# Patient Record
Sex: Female | Born: 1980
Health system: Southern US, Community
[De-identification: ages and names within clinical notes are randomized; demographics above are authoritative.]

## PROBLEM LIST (undated history)

## (undated) DIAGNOSIS — Z9889 Other specified postprocedural states: Secondary | ICD-10-CM

## (undated) DIAGNOSIS — F419 Anxiety disorder, unspecified: Secondary | ICD-10-CM

## (undated) DIAGNOSIS — T781XXA Other adverse food reactions, not elsewhere classified, initial encounter: Secondary | ICD-10-CM

## (undated) DIAGNOSIS — Z862 Personal history of diseases of the blood and blood-forming organs and certain disorders involving the immune mechanism: Secondary | ICD-10-CM

## (undated) DIAGNOSIS — R112 Nausea with vomiting, unspecified: Secondary | ICD-10-CM

## (undated) DIAGNOSIS — M1990 Unspecified osteoarthritis, unspecified site: Secondary | ICD-10-CM

## (undated) HISTORY — PX: WISDOM TOOTH EXTRACTION: SHX21

## (undated) HISTORY — PX: SHOULDER SURGERY: SHX246

---

## 2006-01-08 ENCOUNTER — Emergency Department (HOSPITAL_COMMUNITY): Admission: EM | Admit: 2006-01-08 | Discharge: 2006-01-08 | Payer: Self-pay | Admitting: Emergency Medicine

## 2008-08-31 ENCOUNTER — Other Ambulatory Visit: Admission: RE | Admit: 2008-08-31 | Discharge: 2008-08-31 | Payer: Self-pay | Admitting: Obstetrics and Gynecology

## 2010-06-02 ENCOUNTER — Emergency Department (HOSPITAL_COMMUNITY)
Admission: EM | Admit: 2010-06-02 | Discharge: 2010-06-02 | Disposition: A | Discharge status: Home / Self Care | Payer: BC Managed Care – PPO | Attending: Emergency Medicine | Admitting: Emergency Medicine

## 2010-06-02 DIAGNOSIS — R21 Rash and other nonspecific skin eruption: Secondary | ICD-10-CM | POA: Insufficient documentation

## 2010-06-02 DIAGNOSIS — M25569 Pain in unspecified knee: Secondary | ICD-10-CM | POA: Insufficient documentation

## 2010-06-02 DIAGNOSIS — R209 Unspecified disturbances of skin sensation: Secondary | ICD-10-CM | POA: Insufficient documentation

## 2010-06-02 LAB — CBC
HCT: 40.3 % (ref 36.0–46.0)
MCHC: 33 g/dL (ref 30.0–36.0)
Platelets: 226 10*3/uL (ref 150–400)
RDW: 12.5 % (ref 11.5–15.5)

## 2010-06-02 LAB — POCT PREGNANCY, URINE: Preg Test, Ur: NEGATIVE

## 2010-06-02 LAB — BASIC METABOLIC PANEL
BUN: 15 mg/dL (ref 6–23)
Calcium: 9.1 mg/dL (ref 8.4–10.5)
GFR calc non Af Amer: >60 mL/min (ref >60)
Glucose, Bld: 86 mg/dL (ref 70–99)
Sodium: 137 mEq/L (ref 135–145)

## 2010-06-02 LAB — DIFFERENTIAL
Basophils Absolute: 0.1 10*3/uL (ref 0.0–0.1)
Eosinophils Absolute: 0.4 10*3/uL (ref 0.0–0.7)
Eosinophils Relative: 3 % (ref 0–5)
Lymphocytes Relative: 23 % (ref 12–46)
Monocytes Absolute: 1.2 10*3/uL — ABNORMAL HIGH (ref 0.1–1.0)

## 2010-06-02 LAB — SEDIMENTATION RATE: Sed Rate: 1 mm/hr (ref 0–22)

## 2010-09-25 ENCOUNTER — Other Ambulatory Visit (HOSPITAL_COMMUNITY)
Admission: RE | Admit: 2010-09-25 | Discharge: 2010-09-25 | Disposition: A | Discharge status: Home / Self Care | Payer: BC Managed Care – PPO | Source: Ambulatory Visit | Attending: Obstetrics and Gynecology | Admitting: Obstetrics and Gynecology

## 2010-09-25 DIAGNOSIS — Z01419 Encounter for gynecological examination (general) (routine) without abnormal findings: Secondary | ICD-10-CM | POA: Insufficient documentation

## 2012-04-27 NOTE — L&D Delivery Note (Signed)
Delivery Note At 11:45 AM a viable female was delivered via Vaginal, Spontaneous Delivery (Presentation: ; Occiput Anterior).  APGAR: 9, 9; weight .   Placenta status: Intact, Spontaneous.  Cord:  with the following complications: None.  Cord pH: n/a  Anesthesia: Epidural  Episiotomy: None Lacerations: None Suture Repair: None Est. Blood Loss (mL): 300  Mom to postpartum.  Baby to skin to skin.  Geryl Rankins 12/18/2012, 1:09 PM

## 2012-05-13 LAB — OB RESULTS CONSOLE ABO/RH: RH Type: POSITIVE

## 2012-05-27 ENCOUNTER — Other Ambulatory Visit (HOSPITAL_COMMUNITY)
Admission: RE | Admit: 2012-05-27 | Discharge: 2012-05-27 | Disposition: A | Discharge status: Home / Self Care | Payer: BC Managed Care – PPO | Source: Ambulatory Visit | Attending: Obstetrics and Gynecology | Admitting: Obstetrics and Gynecology

## 2012-05-27 ENCOUNTER — Other Ambulatory Visit: Payer: Self-pay | Admitting: Obstetrics and Gynecology

## 2012-05-27 DIAGNOSIS — Z01419 Encounter for gynecological examination (general) (routine) without abnormal findings: Secondary | ICD-10-CM | POA: Insufficient documentation

## 2012-05-27 DIAGNOSIS — Z113 Encounter for screening for infections with a predominantly sexual mode of transmission: Secondary | ICD-10-CM | POA: Insufficient documentation

## 2012-05-27 DIAGNOSIS — Z1151 Encounter for screening for human papillomavirus (HPV): Secondary | ICD-10-CM | POA: Insufficient documentation

## 2012-08-24 ENCOUNTER — Inpatient Hospital Stay (HOSPITAL_COMMUNITY): Admission: AD | Admit: 2012-08-24 | Payer: Self-pay | Source: Ambulatory Visit | Admitting: Obstetrics and Gynecology

## 2012-12-18 ENCOUNTER — Inpatient Hospital Stay (HOSPITAL_COMMUNITY): Payer: BC Managed Care – PPO | Admitting: Anesthesiology

## 2012-12-18 ENCOUNTER — Encounter (HOSPITAL_COMMUNITY): Payer: Self-pay | Admitting: *Deleted

## 2012-12-18 ENCOUNTER — Inpatient Hospital Stay (HOSPITAL_COMMUNITY)
Admission: AD | Admit: 2012-12-18 | Discharge: 2012-12-20 | DRG: 373 | Disposition: A | Discharge status: Home / Self Care | Payer: BC Managed Care – PPO | Source: Ambulatory Visit | Attending: Obstetrics and Gynecology | Admitting: Obstetrics and Gynecology

## 2012-12-18 ENCOUNTER — Encounter (HOSPITAL_COMMUNITY): Payer: Self-pay | Admitting: Anesthesiology

## 2012-12-18 DIAGNOSIS — R635 Abnormal weight gain: Secondary | ICD-10-CM | POA: Diagnosis present

## 2012-12-18 DIAGNOSIS — O365990 Maternal care for other known or suspected poor fetal growth, unspecified trimester, not applicable or unspecified: Principal | ICD-10-CM | POA: Diagnosis present

## 2012-12-18 DIAGNOSIS — IMO0001 Reserved for inherently not codable concepts without codable children: Secondary | ICD-10-CM

## 2012-12-18 LAB — CBC
Hemoglobin: 12 g/dL (ref 12.0–15.0)
MCH: 30.8 pg (ref 26.0–34.0)
Platelets: 260 10*3/uL (ref 150–400)
RBC: 3.9 MIL/uL (ref 3.87–5.11)
WBC: 16.5 10*3/uL — ABNORMAL HIGH (ref 4.0–10.5)

## 2012-12-18 LAB — RPR: RPR Ser Ql: NONREACTIVE

## 2012-12-18 LAB — POCT FERN TEST: POCT Fern Test: POSITIVE

## 2012-12-18 MED ORDER — ONDANSETRON HCL 4 MG/2ML IJ SOLN: 4.0000 mg | INTRAMUSCULAR | Status: DC | PRN

## 2012-12-18 MED ORDER — LACTATED RINGERS IV SOLN: 500.0000 mL | INTRAVENOUS | Status: DC | PRN

## 2012-12-18 MED ORDER — CITRIC ACID-SODIUM CITRATE 334-500 MG/5ML PO SOLN: 30.0000 mL | ORAL | Status: DC | PRN

## 2012-12-18 MED ORDER — BENZOCAINE-MENTHOL 20-0.5 % EX AERO: 1.0000 "application " | INHALATION_SPRAY | CUTANEOUS | Status: DC | PRN

## 2012-12-18 MED ORDER — IBUPROFEN 600 MG PO TABS
600.0000 mg | ORAL_TABLET | Freq: Four times a day (QID) | ORAL | Status: DC | PRN
  Administered 2012-12-18: 600 mg via ORAL
  Filled 2012-12-18 (×7): qty 1

## 2012-12-18 MED ORDER — PHENYLEPHRINE 40 MCG/ML (10ML) SYRINGE FOR IV PUSH (FOR BLOOD PRESSURE SUPPORT)
80.0000 ug | PREFILLED_SYRINGE | INTRAVENOUS | Status: DC | PRN
  Filled 2012-12-18: qty 2
  Filled 2012-12-18: qty 5

## 2012-12-18 MED ORDER — SENNOSIDES-DOCUSATE SODIUM 8.6-50 MG PO TABS
2.0000 | ORAL_TABLET | Freq: Every day | ORAL | Status: DC
  Administered 2012-12-19: 2 via ORAL

## 2012-12-18 MED ORDER — LACTATED RINGERS IV SOLN
INTRAVENOUS | Status: DC
  Administered 2012-12-18: 09:00:00 via INTRAVENOUS

## 2012-12-18 MED ORDER — FENTANYL 2.5 MCG/ML BUPIVACAINE 1/10 % EPIDURAL INFUSION (WH - ANES)
14.0000 mL/h | INTRAMUSCULAR | Status: DC | PRN
  Filled 2012-12-18: qty 125

## 2012-12-18 MED ORDER — FENTANYL 2.5 MCG/ML BUPIVACAINE 1/10 % EPIDURAL INFUSION (WH - ANES)
INTRAMUSCULAR | Status: DC | PRN
  Administered 2012-12-18: 14 mL/h via EPIDURAL

## 2012-12-18 MED ORDER — LIDOCAINE HCL (PF) 1 % IJ SOLN
INTRAMUSCULAR | Status: DC | PRN
  Administered 2012-12-18 (×3): 5 mL

## 2012-12-18 MED ORDER — ONDANSETRON HCL 4 MG PO TABS: 4.0000 mg | ORAL_TABLET | ORAL | Status: DC | PRN

## 2012-12-18 MED ORDER — ONDANSETRON HCL 4 MG/2ML IJ SOLN: 4.0000 mg | Freq: Four times a day (QID) | INTRAMUSCULAR | Status: DC | PRN

## 2012-12-18 MED ORDER — OXYTOCIN BOLUS FROM INFUSION: 500.0000 mL | INTRAVENOUS | Status: DC

## 2012-12-18 MED ORDER — FERROUS SULFATE 325 (65 FE) MG PO TABS
325.0000 mg | ORAL_TABLET | Freq: Two times a day (BID) | ORAL | Status: DC
  Administered 2012-12-18 – 2012-12-20 (×4): 325 mg via ORAL
  Filled 2012-12-18 (×4): qty 1

## 2012-12-18 MED ORDER — DIPHENHYDRAMINE HCL 25 MG PO CAPS: 25.0000 mg | ORAL_CAPSULE | Freq: Four times a day (QID) | ORAL | Status: DC | PRN

## 2012-12-18 MED ORDER — ZOLPIDEM TARTRATE 5 MG PO TABS: 5.0000 mg | ORAL_TABLET | Freq: Every evening | ORAL | Status: DC | PRN

## 2012-12-18 MED ORDER — LIDOCAINE HCL (PF) 1 % IJ SOLN
30.0000 mL | INTRAMUSCULAR | Status: DC | PRN
  Filled 2012-12-18 (×2): qty 30

## 2012-12-18 MED ORDER — PHENYLEPHRINE 40 MCG/ML (10ML) SYRINGE FOR IV PUSH (FOR BLOOD PRESSURE SUPPORT)
80.0000 ug | PREFILLED_SYRINGE | INTRAVENOUS | Status: DC | PRN
  Filled 2012-12-18: qty 2

## 2012-12-18 MED ORDER — EPHEDRINE 5 MG/ML INJ
10.0000 mg | INTRAVENOUS | Status: DC | PRN
  Filled 2012-12-18: qty 2

## 2012-12-18 MED ORDER — EPHEDRINE 5 MG/ML INJ
10.0000 mg | INTRAVENOUS | Status: DC | PRN
  Filled 2012-12-18: qty 2
  Filled 2012-12-18: qty 4

## 2012-12-18 MED ORDER — METHYLERGONOVINE MALEATE 0.2 MG PO TABS: 0.2000 mg | ORAL_TABLET | ORAL | Status: DC | PRN

## 2012-12-18 MED ORDER — LACTATED RINGERS IV SOLN
500.0000 mL | Freq: Once | INTRAVENOUS | Status: AC
  Administered 2012-12-18: 500 mL via INTRAVENOUS

## 2012-12-18 MED ORDER — IBUPROFEN 600 MG PO TABS
600.0000 mg | ORAL_TABLET | Freq: Four times a day (QID) | ORAL | Status: DC
  Administered 2012-12-18 – 2012-12-20 (×7): 600 mg via ORAL
  Filled 2012-12-18 (×2): qty 1

## 2012-12-18 MED ORDER — OXYCODONE-ACETAMINOPHEN 5-325 MG PO TABS: 1.0000 | ORAL_TABLET | ORAL | Status: DC | PRN

## 2012-12-18 MED ORDER — TETANUS-DIPHTH-ACELL PERTUSSIS 5-2.5-18.5 LF-MCG/0.5 IM SUSP
0.5000 mL | Freq: Once | INTRAMUSCULAR | Status: AC
  Administered 2012-12-19: 0.5 mL via INTRAMUSCULAR

## 2012-12-18 MED ORDER — DIPHENHYDRAMINE HCL 50 MG/ML IJ SOLN: 12.5000 mg | INTRAMUSCULAR | Status: DC | PRN

## 2012-12-18 MED ORDER — DIBUCAINE 1 % RE OINT: 1.0000 "application " | TOPICAL_OINTMENT | RECTAL | Status: DC | PRN

## 2012-12-18 MED ORDER — PRENATAL MULTIVITAMIN CH
1.0000 | ORAL_TABLET | Freq: Every day | ORAL | Status: DC
  Administered 2012-12-19 – 2012-12-20 (×2): 1 via ORAL
  Filled 2012-12-18 (×2): qty 1

## 2012-12-18 MED ORDER — METHYLERGONOVINE MALEATE 0.2 MG/ML IJ SOLN
0.2000 mg | INTRAMUSCULAR | Status: DC | PRN
Start: 2012-12-18 — End: 2012-12-20

## 2012-12-18 MED ORDER — FLEET ENEMA 7-19 GM/118ML RE ENEM: 1.0000 | ENEMA | RECTAL | Status: DC | PRN

## 2012-12-18 MED ORDER — SIMETHICONE 80 MG PO CHEW: 80.0000 mg | CHEWABLE_TABLET | ORAL | Status: DC | PRN

## 2012-12-18 MED ORDER — MEASLES, MUMPS & RUBELLA VAC ~~LOC~~ INJ
0.5000 mL | INJECTION | Freq: Once | SUBCUTANEOUS | Status: DC
Start: 2012-12-19 — End: 2012-12-20

## 2012-12-18 MED ORDER — OXYTOCIN 40 UNITS IN LACTATED RINGERS INFUSION - SIMPLE MED: 62.5000 mL/h | INTRAVENOUS | Status: DC | PRN

## 2012-12-18 MED ORDER — WITCH HAZEL-GLYCERIN EX PADS: 1.0000 "application " | MEDICATED_PAD | CUTANEOUS | Status: DC | PRN

## 2012-12-18 MED ORDER — OXYTOCIN 40 UNITS IN LACTATED RINGERS INFUSION - SIMPLE MED
62.5000 mL/h | INTRAVENOUS | Status: DC
Start: 2012-12-18 — End: 2012-12-20
  Administered 2012-12-18: 62.5 mL/h via INTRAVENOUS
  Filled 2012-12-18: qty 1000

## 2012-12-18 MED ORDER — MAGNESIUM HYDROXIDE 400 MG/5ML PO SUSP: 30.0000 mL | ORAL | Status: DC | PRN

## 2012-12-18 MED ORDER — ACETAMINOPHEN 325 MG PO TABS: 650.0000 mg | ORAL_TABLET | ORAL | Status: DC | PRN

## 2012-12-18 MED ORDER — LANOLIN HYDROUS EX OINT: TOPICAL_OINTMENT | CUTANEOUS | Status: DC | PRN

## 2012-12-18 NOTE — Anesthesia Procedure Notes (Signed)
Epidural Patient location during procedure: OB  Staffing Anesthesiologist: Briseidy Spark Performed by: anesthesiologist   Preanesthetic Checklist Completed: patient identified, site marked, surgical consent, pre-op evaluation, timeout performed, IV checked, risks and benefits discussed and monitors and equipment checked  Epidural Patient position: sitting Prep: ChloraPrep Patient monitoring: heart rate, continuous pulse ox and blood pressure Approach: right paramedian Injection technique: LOR saline  Needle:  Needle type: Tuohy  Needle gauge: 17 G Needle length: 9 cm and 9 Needle insertion depth: 5 cm Catheter type: closed end flexible Catheter size: 20 Guage Catheter at skin depth: 10 cm Test dose: negative  Assessment Events: blood not aspirated, injection not painful, no injection resistance, negative IV test and no paresthesia  Additional Notes   Patient tolerated the insertion well without complications.   

## 2012-12-18 NOTE — Progress Notes (Signed)
Report to [facility] in [place]. Pt to BS via w/c

## 2012-12-18 NOTE — H&P (Signed)
Sarah Mcfarland is a 32 y.o. female G4 P1021 at 88 6/7 weeks presenting with c/o ROM clear at 0200. +Fern testing on arrival. Pt has progressed spontaneously to active labor and now completely dilated.  Denied vaginal bleeding.  Prenatal care with Dr. Gerald Leitz since 9 weeks.  Complicated by poor maternal weight gain and SGA infant (18%).    Maternal Medical History:  Reason for admission: Rupture of membranes.   Contractions: Onset was 6-12 hours ago.    Fetal activity: Perceived fetal activity is normal.    Prenatal complications: no prenatal complications Prenatal Complications - Diabetes: none.    OB History   Grav Para Term Preterm Abortions TAB SAB Ect Mult Living   4 2 2  2  2   2      Past Medical History  Diagnosis Date  . Medical history non-contributory    Past Surgical History  Procedure Laterality Date  . Shoulder surgery     Family History: family history is negative for Alcohol abuse. Social History:  reports that she has quit smoking. She does not have any smokeless tobacco history on file. She reports that she does not drink alcohol or use illicit drugs.   Prenatal Transfer Tool  Maternal Diabetes: No Genetic Screening: Declined Maternal Ultrasounds/Referrals: Normal Fetal Ultrasounds or other Referrals:  None Maternal Substance Abuse:  No Significant Maternal Medications:  None Significant Maternal Lab Results:  None Other Comments:  Baby was SGA (18%) but not IUGR.  Review of Systems  Gastrointestinal: Positive for abdominal pain.    Dilation: 10 Effacement (%): 100 Station: +2 Exam by:: Amonie Wisser Blood pressure 114/56, pulse 82, temperature 98.2 F (36.8 C), temperature source Oral, resp. rate 20, height 5\' 8"  (1.727 m), weight 74.662 kg (164 lb 9.6 oz), SpO2 99.00%, unknown if currently breastfeeding. Maternal Exam:  Uterine Assessment: Contraction strength is firm.  Contraction frequency is regular.   Abdomen: Patient reports no abdominal  tenderness. Estimated fetal weight is 6 1/2 lb.   Fetal presentation: vertex  Introitus: Normal vulva. Ferning test: positive.  Amniotic fluid character: clear and bloody.  Pelvis: adequate for delivery.   Cervix: not evaluated. Baby at +2, +3 station  Fetal Exam Fetal Monitor Review: Mode: fetoscope.   Variability: moderate (6-25 bpm).   Pattern: accelerations present.    Fetal State Assessment: Category I - tracings are normal.     Physical Exam  Constitutional: She is oriented to person, place, and time. She appears well-developed and well-nourished. No distress.  HENT:  Head: Normocephalic and atraumatic.  Eyes: EOM are normal.  Neck: Normal range of motion.  Cardiovascular: Normal rate, regular rhythm and normal heart sounds.   Respiratory: Effort normal and breath sounds normal. No respiratory distress.  GI: Soft.  Genitourinary: Vagina normal.  Musculoskeletal: Normal range of motion.  Neurological: She is alert and oriented to person, place, and time.  Skin: Skin is warm and dry.  Psychiatric: She has a normal mood and affect.    Prenatal labs: ABO, Rh: B/Positive/-- (01/17 0000) Antibody: Negative (01/17 0000) Rubella: Immune (01/17 0000) RPR: Nonreactive (01/17 0000)  HBsAg: Negative (01/17 0000)  HIV: Non-reactive (01/17 0000)  GBS: Negative (08/06 0000)   Assessment/Plan: IUP at 38 6/7 weeks, Active labor Comfortable s/p epidural. Start pushing now.  Anticipate NSVD.   Geryl Rankins 12/18/2012, 2:16 PM

## 2012-12-18 NOTE — MAU Note (Signed)
Leaking cl fld since 0200. Mild irreg ctxs

## 2012-12-18 NOTE — Anesthesia Postprocedure Evaluation (Signed)
  Anesthesia Post-op Note  Patient: Sarah Mcfarland  Procedure(s) Performed: * No procedures listed *  Patient Location: PACU and Mother/Baby  Anesthesia Type:Epidural  Level of Consciousness: awake, alert  and oriented  Airway and Oxygen Therapy: Patient Spontanous Breathing  Post-op Pain: mild  Post-op Assessment: Patient's Cardiovascular Status Stable, Respiratory Function Stable, Patent Airway, No signs of Nausea or vomiting, Adequate PO intake and Pain level controlled  Post-op Vital Signs: stable  Complications: No apparent anesthesia complications

## 2012-12-18 NOTE — Progress Notes (Signed)
Dr Dion Body notified of pt's admission and status. Will admit to [facility]

## 2012-12-18 NOTE — Progress Notes (Signed)
Threasa Heads CNM in to do spec exam to r/o srom. Pooling noted with clear fld

## 2012-12-18 NOTE — Anesthesia Preprocedure Evaluation (Signed)

## 2012-12-19 LAB — CBC
HCT: 32.5 % — ABNORMAL LOW (ref 36.0–46.0)
MCHC: 34.2 g/dL (ref 30.0–36.0)
MCV: 87.8 fL (ref 78.0–100.0)
RDW: 13.1 % (ref 11.5–15.5)

## 2012-12-19 NOTE — Progress Notes (Signed)
Post Partum Day 1 s/p svd  Subjective: no complaints, up ad lib, voiding, tolerating PO and + flatus  Objective: Blood pressure 105/64, pulse 63, temperature 97.7 F (36.5 C), temperature source Oral, resp. rate 18, height 5\' 8"  (1.727 m), weight 74.662 kg (164 lb 9.6 oz), SpO2 99.00%, unknown if currently breastfeeding.  Physical Exam:  General: alert and cooperative Lochia: appropriate Uterine Fundus: firm Incision: NA DVT Evaluation: No evidence of DVT seen on physical exam.   Recent Labs  12/18/12 0630 12/19/12 0555  HGB 12.0 11.1*  HCT 34.8* 32.5*    Assessment/Plan: Plan for discharge tomorrow and Breastfeeding   LOS: 1 day   Mileidy Atkin J. 12/19/2012, 12:58 PM

## 2012-12-20 DIAGNOSIS — IMO0001 Reserved for inherently not codable concepts without codable children: Secondary | ICD-10-CM

## 2012-12-20 MED ORDER — IBUPROFEN 600 MG PO TABS: 600.0000 mg | ORAL_TABLET | Freq: Four times a day (QID) | ORAL | Status: DC | PRN

## 2012-12-20 NOTE — Discharge Summary (Signed)
Obstetric Discharge Summary Reason for Admission: onset of labor Prenatal Procedures: None Intrapartum Procedures: spontaneous vaginal delivery Postpartum Procedures: none Complications-Operative and Postpartum: none Hemoglobin  Date Value Range Status  12/19/2012 11.1* 12.0 - 15.0 g/dL Final     HCT  Date Value Range Status  12/19/2012 32.5* 36.0 - 46.0 % Final    Physical Exam:  General: alert and cooperative Lochia: appropriate Uterine Fundus: firm Incision: NA DVT Evaluation: No evidence of DVT seen on physical exam.  Discharge Diagnoses: Term Pregnancy-delivered  Discharge Information: Date: 12/20/2012 Activity: pelvic rest Diet: routine Medications: PNV and Ibuprofen Condition: stable Instructions: refer to practice specific booklet Discharge to: home Follow-up Information   Follow up with Jessee Avers., MD. Schedule an appointment as soon as possible for a visit in 2 weeks. (postpartum visit to evaluate for postpartum depression .)    Specialty:  Obstetrics and Gynecology   Contact information:   [address] [place] [address] [phone]       Newborn Data: Live born female  Birth Weight: 6 lb 4 oz (2835 g) APGAR: 9, 9  Home with mother.  Brigette Hopfer J. 12/20/2012, 8:17 AM

## 2014-02-26 ENCOUNTER — Encounter (HOSPITAL_COMMUNITY): Payer: Self-pay | Admitting: *Deleted

## 2015-05-23 ENCOUNTER — Other Ambulatory Visit: Payer: Self-pay | Admitting: Orthopedic Surgery

## 2015-06-17 ENCOUNTER — Encounter (HOSPITAL_COMMUNITY)
Admission: RE | Admit: 2015-06-17 | Discharge: 2015-06-17 | Disposition: A | Discharge status: Home / Self Care | Payer: BLUE CROSS/BLUE SHIELD | Source: Ambulatory Visit | Attending: Orthopedic Surgery | Admitting: Orthopedic Surgery

## 2015-06-17 ENCOUNTER — Ambulatory Visit (HOSPITAL_COMMUNITY)
Admission: RE | Admit: 2015-06-17 | Discharge: 2015-06-17 | Disposition: A | Discharge status: Home / Self Care | Payer: BLUE CROSS/BLUE SHIELD | Source: Ambulatory Visit | Attending: Orthopedic Surgery | Admitting: Orthopedic Surgery

## 2015-06-17 ENCOUNTER — Encounter (HOSPITAL_COMMUNITY): Payer: Self-pay

## 2015-06-17 DIAGNOSIS — Z0183 Encounter for blood typing: Secondary | ICD-10-CM | POA: Insufficient documentation

## 2015-06-17 DIAGNOSIS — Z01812 Encounter for preprocedural laboratory examination: Secondary | ICD-10-CM | POA: Diagnosis not present

## 2015-06-17 DIAGNOSIS — M1612 Unilateral primary osteoarthritis, left hip: Secondary | ICD-10-CM | POA: Insufficient documentation

## 2015-06-17 DIAGNOSIS — Z01818 Encounter for other preprocedural examination: Secondary | ICD-10-CM | POA: Diagnosis present

## 2015-06-17 DIAGNOSIS — F17200 Nicotine dependence, unspecified, uncomplicated: Secondary | ICD-10-CM | POA: Insufficient documentation

## 2015-06-17 HISTORY — DX: Unspecified osteoarthritis, unspecified site: M19.90

## 2015-06-17 HISTORY — DX: Personal history of diseases of the blood and blood-forming organs and certain disorders involving the immune mechanism: Z86.2

## 2015-06-17 HISTORY — DX: Other adverse food reactions, not elsewhere classified, initial encounter: T78.1XXA

## 2015-06-17 HISTORY — DX: Other specified postprocedural states: R11.2

## 2015-06-17 HISTORY — DX: Other specified postprocedural states: Z98.890

## 2015-06-17 HISTORY — DX: Anxiety disorder, unspecified: F41.9

## 2015-06-17 LAB — COMPREHENSIVE METABOLIC PANEL
ALT: 26 U/L (ref 14–54)
AST: 19 U/L (ref 15–41)
Albumin: 4.1 g/dL (ref 3.5–5.0)
Alkaline Phosphatase: 93 U/L (ref 38–126)
Anion gap: 12 (ref 5–15)
BUN: 13 mg/dL (ref 6–20)
CHLORIDE: 101 mmol/L (ref 101–111)
CO2: 24 mmol/L (ref 22–32)
CREATININE: 0.7 mg/dL (ref 0.44–1.00)
Calcium: 9.1 mg/dL (ref 8.9–10.3)
Glucose, Bld: 82 mg/dL (ref 65–99)
POTASSIUM: 3.6 mmol/L (ref 3.5–5.1)
Sodium: 137 mmol/L (ref 135–145)
Total Bilirubin: 0.3 mg/dL (ref 0.3–1.2)
Total Protein: 7.2 g/dL (ref 6.5–8.1)

## 2015-06-17 LAB — ABO/RH: ABO/RH(D): B POS

## 2015-06-17 LAB — URINALYSIS, ROUTINE W REFLEX MICROSCOPIC
Bilirubin Urine: NEGATIVE
GLUCOSE, UA: NEGATIVE mg/dL
KETONES UR: NEGATIVE mg/dL
LEUKOCYTES UA: NEGATIVE
Nitrite: NEGATIVE
PH: 6.5 (ref 5.0–8.0)
Protein, ur: NEGATIVE mg/dL
SPECIFIC GRAVITY, URINE: 1.002 — AB (ref 1.005–1.030)

## 2015-06-17 LAB — HCG, SERUM, QUALITATIVE: Preg, Serum: NEGATIVE

## 2015-06-17 LAB — CBC WITH DIFFERENTIAL/PLATELET
BASOS ABS: 0 10*3/uL (ref 0.0–0.1)
BASOS PCT: 0 %
Eosinophils Absolute: 0.1 10*3/uL (ref 0.0–0.7)
Eosinophils Relative: 2 %
HCT: 38.3 % (ref 36.0–46.0)
Hemoglobin: 12.9 g/dL (ref 12.0–15.0)
LYMPHS PCT: 31 %
Lymphs Abs: 2.2 10*3/uL (ref 0.7–4.0)
MCH: 30.6 pg (ref 26.0–34.0)
MCHC: 33.7 g/dL (ref 30.0–36.0)
MCV: 91 fL (ref 78.0–100.0)
Monocytes Absolute: 0.7 10*3/uL (ref 0.1–1.0)
Monocytes Relative: 10 %
NEUTROS ABS: 3.9 10*3/uL (ref 1.7–7.7)
NEUTROS PCT: 57 %
PLATELETS: 229 10*3/uL (ref 150–400)
RBC: 4.21 MIL/uL (ref 3.87–5.11)
RDW: 12.2 % (ref 11.5–15.5)
WBC: 6.9 10*3/uL (ref 4.0–10.5)

## 2015-06-17 LAB — URINE MICROSCOPIC-ADD ON: WBC UA: NONE SEEN WBC/hpf (ref 0–5)

## 2015-06-17 LAB — PROTIME-INR
INR: 0.98 (ref 0.00–1.49)
PROTHROMBIN TIME: 13.2 s (ref 11.6–15.2)

## 2015-06-17 LAB — SURGICAL PCR SCREEN
MRSA, PCR: NEGATIVE
STAPHYLOCOCCUS AUREUS: NEGATIVE

## 2015-06-17 LAB — APTT: APTT: 30 s (ref 24–37)

## 2015-06-17 NOTE — Progress Notes (Signed)
PCP - Dr. Milus Height at [facility] Cardiologist - denies  EKG - 06/17/15 CXR - 06/17/15  Echo/stress test/cardiac cath - denies  Patient denies chest pain and shortness of breat at PAT appointment.

## 2015-06-17 NOTE — Pre-Procedure Instructions (Signed)
    Sarah Mcfarland  06/17/2015      [id number] [place], [place] - [address] [place] [address] Phone: [phone] Fax: [phone]    Your procedure is scheduled on Friday, March 3rd, 2017.  Report to [facility] Admitting at 5:30 A.M.  Call this number if you have problems the morning of surgery:  [phone]   Remember:  Do not eat food or drink liquids after midnight.   Take these medicines the morning of surgery with A SIP OF WATER: Tramadol (Ultram) if needed.  7 days prior to surgery, stop taking: Aspirin, NSAIDS, Aleve, Naproxen, Ibuprofen, Advil, Motrin, BC's, Goody's, Fish oil, all herbal medications, and all vitamins.    Do not wear jewelry, make-up or nail polish.  Do not wear lotions, powders, or perfumes.   Do not shave 48 hours prior to surgery.   Do not bring valuables to the hospital.   [facility] is not responsible for any belongings or valuables.  Contacts, dentures or bridgework may not be worn into surgery.  Leave your suitcase in the car.  After surgery it may be brought to your room.  For patients admitted to the hospital, discharge time will be determined by your treatment team.  Patients discharged the day of surgery will not be allowed to drive home.   Special instructions:  See attached.   Please read over the following fact sheets that you were given. Pain Booklet, Coughing and Deep Breathing, Blood Transfusion Information, MRSA Information and Surgical Site Infection Prevention

## 2015-06-18 LAB — TYPE AND SCREEN
ABO/RH(D): B POS
Antibody Screen: NEGATIVE

## 2015-06-27 MED ORDER — CEFAZOLIN SODIUM-DEXTROSE 2-3 GM-% IV SOLR
2.0000 g | INTRAVENOUS | Status: AC
  Administered 2015-06-28: 2 g via INTRAVENOUS
  Filled 2015-06-27: qty 50

## 2015-06-27 MED ORDER — CHLORHEXIDINE GLUCONATE 4 % EX LIQD: 60.0000 mL | Freq: Once | CUTANEOUS | Status: DC

## 2015-06-27 NOTE — H&P (Signed)
TOTAL HIP ADMISSION H&P  Patient is admitted for left total hip arthroplasty.  Subjective:  Chief Complaint: left hip pain  HPI: Sarah Mcfarland, 35 y.o. female, has a history of pain and functional disability in the left hip(s) due to arthritis and patient has failed non-surgical conservative treatments for greater than 12 weeks to include NSAID's and/or analgesics, use of assistive devices, weight reduction as appropriate and activity modification.  Onset of symptoms was gradual starting 2 years ago with gradually worsening course since that time.The patient noted no past surgery on the left hip(s).  Patient currently rates pain in the left hip at 8 out of 10 with activity. Patient has night pain, worsening of pain with activity and weight bearing, trendelenberg gait, pain that interfers with activities of daily living, pain with passive range of motion, crepitus and joint swelling. Patient has evidence of avascular necrosis with an area of collapse by imaging studies. This condition presents safety issues increasing the risk of falls. This patient has had avascular necrosis of the hip, acetabular fracture, hip dysplasia.  There is no current active infection.  Patient Active Problem List   Diagnosis Date Noted  . Active labor 12/20/2012   Past Medical History  Diagnosis Date  . PONV (postoperative nausea and vomiting)   . Oral allergy syndrome   . Anxiety   . Arthritis   . History of anemia     during pregnancy    Past Surgical History  Procedure Laterality Date  . Shoulder surgery Left   . Wisdom tooth extraction      No prescriptions prior to admission   Allergies  Allergen Reactions  . Adhesive [Tape]     Paper tape only  . Apple Hives and Swelling  . Other Hives and Swelling    Pine nuts-throat swells  Pt has OAS - Oral allergy syndrome - food allergy classified by a cluster of allergic reactions in the mouth in response to eating certain (usually fresh) fruits, nuts, and  vegetables  . Peach [Prunus Persica] Hives and Swelling    Social History  Substance Use Topics  . Smoking status: Current Every Day Smoker -- 0.50 packs/day  . Smokeless tobacco: Not on file  . Alcohol Use: Yes     Comment: rarely    Family History  Problem Relation Age of Onset  . Alcohol abuse Neg Hx      ROS ROS: I have reviewed the patient's review of systems thoroughly and there are no positive responses as relates to the HPI. Objective:  Physical Exam  Vital signs in last 24 hours:   Well-developed well-nourished patient in no acute distress. Alert and oriented x3 HEENT:within normal limits Cardiac: Regular rate and rhythm Pulmonary: Lungs clear to auscultation Abdomen: Soft and nontender.  Normal active bowel sounds  Musculoskeletal: (left hip: Painful range of motion.  Limited range of motion.  Pain with internal/external rotation.) Labs: Recent Results (from the past 2160 hour(s))  Surgical pcr screen     Status: None   Collection Time: 06/17/15  2:15 PM  Result Value Ref Range   MRSA, PCR NEGATIVE NEGATIVE   Staphylococcus aureus NEGATIVE NEGATIVE    Comment:        The Xpert SA Assay (FDA approved for NASAL specimens in patients over 67 years of age), is one component of a comprehensive surveillance program.  Test performance has been validated by [facility] for patients greater than or equal to 61 year old. It is not intended to  diagnose infection nor to guide or monitor treatment.   Urinalysis, Routine w reflex microscopic (not at [facility])     Status: Abnormal   Collection Time: 06/17/15  2:15 PM  Result Value Ref Range   Color, Urine YELLOW YELLOW   APPearance CLEAR CLEAR   Specific Gravity, Urine 1.002 (L) 1.005 - 1.030   pH 6.5 5.0 - 8.0   Glucose, UA NEGATIVE NEGATIVE mg/dL   Hgb urine dipstick SMALL (A) NEGATIVE   Bilirubin Urine NEGATIVE NEGATIVE   Ketones, ur NEGATIVE NEGATIVE mg/dL   Protein, ur NEGATIVE NEGATIVE mg/dL   Nitrite  NEGATIVE NEGATIVE   Leukocytes, UA NEGATIVE NEGATIVE  Urine microscopic-add on     Status: Abnormal   Collection Time: 06/17/15  2:15 PM  Result Value Ref Range   Squamous Epithelial / LPF 0-5 (A) NONE SEEN   WBC, UA NONE SEEN 0 - 5 WBC/hpf   RBC / HPF 0-5 0 - 5 RBC/hpf   Bacteria, UA RARE (A) NONE SEEN  APTT     Status: None   Collection Time: 06/17/15  2:20 PM  Result Value Ref Range   aPTT 30 24 - 37 seconds  CBC WITH DIFFERENTIAL     Status: None   Collection Time: 06/17/15  2:20 PM  Result Value Ref Range   WBC 6.9 4.0 - 10.5 K/uL   RBC 4.21 3.87 - 5.11 MIL/uL   Hemoglobin 12.9 12.0 - 15.0 g/dL   HCT 38.3 36.0 - 46.0 %   MCV 91.0 78.0 - 100.0 fL   MCH 30.6 26.0 - 34.0 pg   MCHC 33.7 30.0 - 36.0 g/dL   RDW 12.2 11.5 - 15.5 %   Platelets 229 150 - 400 K/uL   Neutrophils Relative % 57 %   Neutro Abs 3.9 1.7 - 7.7 K/uL   Lymphocytes Relative 31 %   Lymphs Abs 2.2 0.7 - 4.0 K/uL   Monocytes Relative 10 %   Monocytes Absolute 0.7 0.1 - 1.0 K/uL   Eosinophils Relative 2 %   Eosinophils Absolute 0.1 0.0 - 0.7 K/uL   Basophils Relative 0 %   Basophils Absolute 0.0 0.0 - 0.1 K/uL  Comprehensive metabolic panel     Status: None   Collection Time: 06/17/15  2:20 PM  Result Value Ref Range   Sodium 137 135 - 145 mmol/L   Potassium 3.6 3.5 - 5.1 mmol/L   Chloride 101 101 - 111 mmol/L   CO2 24 22 - 32 mmol/L   Glucose, Bld 82 65 - 99 mg/dL   BUN 13 6 - 20 mg/dL   Creatinine, Ser 0.70 0.44 - 1.00 mg/dL   Calcium 9.1 8.9 - 10.3 mg/dL   Total Protein 7.2 6.5 - 8.1 g/dL   Albumin 4.1 3.5 - 5.0 g/dL   AST 19 15 - 41 U/L   ALT 26 14 - 54 U/L   Alkaline Phosphatase 93 38 - 126 U/L   Total Bilirubin 0.3 0.3 - 1.2 mg/dL   GFR calc non Af Amer >60 >60 mL/min   GFR calc Af Amer >60 >60 mL/min    Comment: (NOTE) The eGFR has been calculated using the CKD EPI equation. This calculation has not been validated in all clinical situations. eGFR's persistently <60 mL/min signify possible  Chronic Kidney Disease.    Anion gap 12 5 - 15  Protime-INR     Status: None   Collection Time: 06/17/15  2:20 PM  Result Value Ref Range   Prothrombin  Time 13.2 11.6 - 15.2 seconds   INR 0.98 0.00 - 1.49  hCG, serum, qualitative     Status: None   Collection Time: 06/17/15  2:20 PM  Result Value Ref Range   Preg, Serum NEGATIVE NEGATIVE    Comment:        THE SENSITIVITY OF THIS METHODOLOGY IS >10 mIU/mL.   Type and screen Order type and screen if day of surgery is less than 15 days from draw of preadmission visit or order morning of surgery if day of surgery is greater than 6 days from preadmission visit.     Status: None   Collection Time: 06/17/15  2:25 PM  Result Value Ref Range   ABO/RH(D) B POS    Antibody Screen NEG    Sample Expiration 07/01/2015    Extend sample reason NO TRANSFUSIONS OR PREGNANCY IN THE PAST 3 MONTHS   ABO/Rh     Status: None   Collection Time: 06/17/15  2:25 PM  Result Value Ref Range   ABO/RH(D) B POS     Estimated body mass index is 25.03 kg/(m^2) as calculated from the following:   Height as of 12/18/12: _0  (1.727 m).   Weight as of 12/18/12: 74.662 kg (164 lb 9.6 oz).   Imaging Review Plain radiographs demonstrate severe degenerative joint disease of the left hip(s). The bone quality appears to be good for age and reported activity level.  Assessment/Plan:  End stage arthritis, left hip(s)  The patient history, physical examination, clinical judgement of the provider and imaging studies are consistent with end stage degenerative joint disease of the left hip(s) and total hip arthroplasty is deemed medically necessary. The treatment options including medical management, injection therapy, arthroscopy and arthroplasty were discussed at length. The risks and benefits of total hip arthroplasty were presented and reviewed. The risks due to aseptic loosening, infection, stiffness, dislocation/subluxation,  thromboembolic complications and other  imponderables were discussed.  The patient acknowledged the explanation, agreed to proceed with the plan and consent was signed. Patient is being admitted for inpatient treatment for surgery, pain control, PT, OT, prophylactic antibiotics, VTE prophylaxis, progressive ambulation and ADL's and discharge planning.The patient is planning to be discharged home with home health services

## 2015-06-28 ENCOUNTER — Inpatient Hospital Stay (HOSPITAL_COMMUNITY): Payer: BLUE CROSS/BLUE SHIELD | Admitting: Anesthesiology

## 2015-06-28 ENCOUNTER — Inpatient Hospital Stay (HOSPITAL_COMMUNITY)
Admission: RE | Admit: 2015-06-28 | Discharge: 2015-06-29 | DRG: 470 | Disposition: A | Discharge status: Home / Self Care | Payer: BLUE CROSS/BLUE SHIELD | Source: Ambulatory Visit | Attending: Orthopedic Surgery | Admitting: Orthopedic Surgery

## 2015-06-28 ENCOUNTER — Inpatient Hospital Stay (HOSPITAL_COMMUNITY): Payer: BLUE CROSS/BLUE SHIELD

## 2015-06-28 ENCOUNTER — Encounter (HOSPITAL_COMMUNITY)
Admission: RE | Disposition: A | Discharge status: Home / Self Care | Payer: Self-pay | Source: Ambulatory Visit | Attending: Orthopedic Surgery

## 2015-06-28 ENCOUNTER — Encounter (HOSPITAL_COMMUNITY): Payer: Self-pay

## 2015-06-28 DIAGNOSIS — M87052 Idiopathic aseptic necrosis of left femur: Secondary | ICD-10-CM | POA: Diagnosis present

## 2015-06-28 DIAGNOSIS — Z419 Encounter for procedure for purposes other than remedying health state, unspecified: Secondary | ICD-10-CM

## 2015-06-28 DIAGNOSIS — Z91010 Allergy to peanuts: Secondary | ICD-10-CM | POA: Diagnosis not present

## 2015-06-28 DIAGNOSIS — D62 Acute posthemorrhagic anemia: Secondary | ICD-10-CM | POA: Diagnosis not present

## 2015-06-28 DIAGNOSIS — M1612 Unilateral primary osteoarthritis, left hip: Principal | ICD-10-CM | POA: Diagnosis present

## 2015-06-28 DIAGNOSIS — Z91048 Other nonmedicinal substance allergy status: Secondary | ICD-10-CM

## 2015-06-28 DIAGNOSIS — M25552 Pain in left hip: Secondary | ICD-10-CM | POA: Diagnosis present

## 2015-06-28 DIAGNOSIS — Z91018 Allergy to other foods: Secondary | ICD-10-CM | POA: Diagnosis not present

## 2015-06-28 DIAGNOSIS — Z791 Long term (current) use of non-steroidal anti-inflammatories (NSAID): Secondary | ICD-10-CM | POA: Diagnosis not present

## 2015-06-28 DIAGNOSIS — F17210 Nicotine dependence, cigarettes, uncomplicated: Secondary | ICD-10-CM | POA: Diagnosis present

## 2015-06-28 HISTORY — PX: TOTAL HIP ARTHROPLASTY: SHX124

## 2015-06-28 SURGERY — ARTHROPLASTY, HIP, TOTAL, ANTERIOR APPROACH
Anesthesia: General | Laterality: Left

## 2015-06-28 MED ORDER — MAGNESIUM CITRATE PO SOLN: 1.0000 | Freq: Once | ORAL | Status: DC | PRN

## 2015-06-28 MED ORDER — KETOROLAC TROMETHAMINE 15 MG/ML IJ SOLN
INTRAMUSCULAR | Status: AC
  Filled 2015-06-28: qty 1

## 2015-06-28 MED ORDER — 0.9 % SODIUM CHLORIDE (POUR BTL) OPTIME
TOPICAL | Status: DC | PRN
  Administered 2015-06-28: 1000 mL

## 2015-06-28 MED ORDER — FENTANYL CITRATE (PF) 250 MCG/5ML IJ SOLN
INTRAMUSCULAR | Status: AC
  Filled 2015-06-28: qty 5

## 2015-06-28 MED ORDER — ALUM & MAG HYDROXIDE-SIMETH 200-200-20 MG/5ML PO SUSP: 30.0000 mL | ORAL | Status: DC | PRN

## 2015-06-28 MED ORDER — ASPIRIN EC 325 MG PO TBEC
325.0000 mg | DELAYED_RELEASE_TABLET | Freq: Two times a day (BID) | ORAL | Status: DC
  Administered 2015-06-28 – 2015-06-29 (×2): 325 mg via ORAL
  Filled 2015-06-28 (×2): qty 1

## 2015-06-28 MED ORDER — TRANEXAMIC ACID 1000 MG/10ML IV SOLN
1000.0000 mg | INTRAVENOUS | Status: AC
  Administered 2015-06-28: 1000 mg via INTRAVENOUS
  Filled 2015-06-28: qty 10

## 2015-06-28 MED ORDER — TRANEXAMIC ACID 1000 MG/10ML IV SOLN
1000.0000 mg | Freq: Once | INTRAVENOUS | Status: AC
  Administered 2015-06-28: 1000 mg via INTRAVENOUS
  Filled 2015-06-28: qty 10

## 2015-06-28 MED ORDER — ARTIFICIAL TEARS OP OINT
TOPICAL_OINTMENT | OPHTHALMIC | Status: AC
  Filled 2015-06-28: qty 3.5

## 2015-06-28 MED ORDER — BUPIVACAINE HCL 0.5 % IJ SOLN
INTRAMUSCULAR | Status: DC | PRN
  Administered 2015-06-28: 20 mL

## 2015-06-28 MED ORDER — MIDAZOLAM HCL 5 MG/5ML IJ SOLN
INTRAMUSCULAR | Status: DC | PRN
  Administered 2015-06-28: 2 mg via INTRAVENOUS

## 2015-06-28 MED ORDER — METHOCARBAMOL 500 MG PO TABS
500.0000 mg | ORAL_TABLET | Freq: Four times a day (QID) | ORAL | Status: DC | PRN
  Administered 2015-06-28 – 2015-06-29 (×3): 500 mg via ORAL
  Filled 2015-06-28 (×3): qty 1

## 2015-06-28 MED ORDER — ROCURONIUM BROMIDE 50 MG/5ML IV SOLN
INTRAVENOUS | Status: AC
  Filled 2015-06-28: qty 1

## 2015-06-28 MED ORDER — BUPIVACAINE HCL (PF) 0.5 % IJ SOLN
INTRAMUSCULAR | Status: AC
  Filled 2015-06-28: qty 30

## 2015-06-28 MED ORDER — SUCCINYLCHOLINE CHLORIDE 20 MG/ML IJ SOLN
INTRAMUSCULAR | Status: AC
  Filled 2015-06-28: qty 1

## 2015-06-28 MED ORDER — MIDAZOLAM HCL 2 MG/2ML IJ SOLN
INTRAMUSCULAR | Status: AC
  Filled 2015-06-28: qty 2

## 2015-06-28 MED ORDER — HYDROMORPHONE HCL 1 MG/ML IJ SOLN
0.5000 mg | INTRAMUSCULAR | Status: DC | PRN
  Administered 2015-06-28: 1 mg via INTRAVENOUS
  Administered 2015-06-29: 0.5 mg via INTRAVENOUS
  Filled 2015-06-28 (×2): qty 1

## 2015-06-28 MED ORDER — PROPOFOL 10 MG/ML IV BOLUS
INTRAVENOUS | Status: DC | PRN
  Administered 2015-06-28: 200 mg via INTRAVENOUS

## 2015-06-28 MED ORDER — BUPIVACAINE LIPOSOME 1.3 % IJ SUSP
20.0000 mL | INTRAMUSCULAR | Status: AC
  Administered 2015-06-28: 20 mL
  Filled 2015-06-28: qty 20

## 2015-06-28 MED ORDER — LIDOCAINE HCL (CARDIAC) 20 MG/ML IV SOLN
INTRAVENOUS | Status: AC
  Filled 2015-06-28: qty 5

## 2015-06-28 MED ORDER — POLYETHYLENE GLYCOL 3350 17 G PO PACK
17.0000 g | PACK | Freq: Every day | ORAL | Status: DC | PRN
  Filled 2015-06-28: qty 1

## 2015-06-28 MED ORDER — NEOSTIGMINE METHYLSULFATE 10 MG/10ML IV SOLN
INTRAVENOUS | Status: AC
  Filled 2015-06-28: qty 1

## 2015-06-28 MED ORDER — OXYCODONE HCL 5 MG PO TABS
5.0000 mg | ORAL_TABLET | ORAL | Status: DC | PRN
  Administered 2015-06-28 – 2015-06-29 (×6): 10 mg via ORAL
  Filled 2015-06-28 (×7): qty 2

## 2015-06-28 MED ORDER — ONDANSETRON HCL 4 MG/2ML IJ SOLN
4.0000 mg | Freq: Four times a day (QID) | INTRAMUSCULAR | Status: DC | PRN
  Administered 2015-06-28: 4 mg via INTRAVENOUS
  Filled 2015-06-28: qty 2

## 2015-06-28 MED ORDER — FENTANYL CITRATE (PF) 100 MCG/2ML IJ SOLN
INTRAMUSCULAR | Status: DC | PRN
Start: 2015-06-28 — End: 2015-06-28
  Administered 2015-06-28 (×7): 50 ug via INTRAVENOUS
  Administered 2015-06-28: 100 ug via INTRAVENOUS
  Administered 2015-06-28: 50 ug via INTRAVENOUS

## 2015-06-28 MED ORDER — ACETAMINOPHEN 325 MG PO TABS: 650.0000 mg | ORAL_TABLET | Freq: Four times a day (QID) | ORAL | Status: DC | PRN

## 2015-06-28 MED ORDER — ZOLPIDEM TARTRATE 5 MG PO TABS: 5.0000 mg | ORAL_TABLET | Freq: Every evening | ORAL | Status: DC | PRN

## 2015-06-28 MED ORDER — HYDROMORPHONE HCL 1 MG/ML IJ SOLN
INTRAMUSCULAR | Status: AC
  Filled 2015-06-28: qty 1

## 2015-06-28 MED ORDER — GLYCOPYRROLATE 0.2 MG/ML IJ SOLN
INTRAMUSCULAR | Status: DC | PRN
  Administered 2015-06-28: 0.6 mg via INTRAVENOUS

## 2015-06-28 MED ORDER — PROPOFOL 10 MG/ML IV BOLUS
INTRAVENOUS | Status: AC
  Filled 2015-06-28: qty 40

## 2015-06-28 MED ORDER — NEOSTIGMINE METHYLSULFATE 10 MG/10ML IV SOLN
INTRAVENOUS | Status: DC | PRN
  Administered 2015-06-28: 4 mg via INTRAVENOUS

## 2015-06-28 MED ORDER — ARTIFICIAL TEARS OP OINT
TOPICAL_OINTMENT | OPHTHALMIC | Status: DC | PRN
  Administered 2015-06-28: 1 via OPHTHALMIC

## 2015-06-28 MED ORDER — ONDANSETRON HCL 4 MG/2ML IJ SOLN
INTRAMUSCULAR | Status: AC
  Filled 2015-06-28: qty 2

## 2015-06-28 MED ORDER — CEFAZOLIN SODIUM-DEXTROSE 2-3 GM-% IV SOLR
2.0000 g | Freq: Four times a day (QID) | INTRAVENOUS | Status: AC
  Administered 2015-06-28 (×2): 2 g via INTRAVENOUS
  Filled 2015-06-28 (×2): qty 50

## 2015-06-28 MED ORDER — PROMETHAZINE HCL 25 MG/ML IJ SOLN: 12.5000 mg | Freq: Four times a day (QID) | INTRAMUSCULAR | Status: DC | PRN

## 2015-06-28 MED ORDER — SODIUM CHLORIDE 0.9 % IV SOLN
INTRAVENOUS | Status: DC
  Administered 2015-06-28: 100 mL/h via INTRAVENOUS

## 2015-06-28 MED ORDER — LACTATED RINGERS IV SOLN
INTRAVENOUS | Status: DC | PRN
  Administered 2015-06-28 (×2): via INTRAVENOUS

## 2015-06-28 MED ORDER — ACETAMINOPHEN 650 MG RE SUPP: 650.0000 mg | Freq: Four times a day (QID) | RECTAL | Status: DC | PRN

## 2015-06-28 MED ORDER — DIPHENHYDRAMINE HCL 12.5 MG/5ML PO ELIX: 12.5000 mg | ORAL_SOLUTION | ORAL | Status: DC | PRN

## 2015-06-28 MED ORDER — LIDOCAINE HCL (CARDIAC) 20 MG/ML IV SOLN
INTRAVENOUS | Status: DC | PRN
  Administered 2015-06-28: 75 mg via INTRAVENOUS
  Administered 2015-06-28: 25 mg via INTRAVENOUS

## 2015-06-28 MED ORDER — GLYCOPYRROLATE 0.2 MG/ML IJ SOLN
INTRAMUSCULAR | Status: AC
  Filled 2015-06-28: qty 3

## 2015-06-28 MED ORDER — OXYCODONE-ACETAMINOPHEN 5-325 MG PO TABS: 1.0000 | ORAL_TABLET | Freq: Four times a day (QID) | ORAL | Status: DC | PRN

## 2015-06-28 MED ORDER — BISACODYL 5 MG PO TBEC: 5.0000 mg | DELAYED_RELEASE_TABLET | Freq: Every day | ORAL | Status: DC | PRN

## 2015-06-28 MED ORDER — SODIUM CHLORIDE 0.9 % IJ SOLN
INTRAMUSCULAR | Status: AC
  Filled 2015-06-28: qty 10

## 2015-06-28 MED ORDER — METHOCARBAMOL 1000 MG/10ML IJ SOLN: 500.0000 mg | Freq: Four times a day (QID) | INTRAVENOUS | Status: DC | PRN

## 2015-06-28 MED ORDER — KETOROLAC TROMETHAMINE 15 MG/ML IJ SOLN
15.0000 mg | Freq: Three times a day (TID) | INTRAMUSCULAR | Status: DC
  Administered 2015-06-28 – 2015-06-29 (×3): 15 mg via INTRAVENOUS
  Filled 2015-06-28 (×4): qty 1

## 2015-06-28 MED ORDER — ROCURONIUM BROMIDE 100 MG/10ML IV SOLN
INTRAVENOUS | Status: DC | PRN
  Administered 2015-06-28: 10 mg via INTRAVENOUS
  Administered 2015-06-28: 40 mg via INTRAVENOUS

## 2015-06-28 MED ORDER — DOCUSATE SODIUM 100 MG PO CAPS
100.0000 mg | ORAL_CAPSULE | Freq: Two times a day (BID) | ORAL | Status: DC
  Administered 2015-06-28 – 2015-06-29 (×3): 100 mg via ORAL
  Filled 2015-06-28 (×4): qty 1

## 2015-06-28 MED ORDER — HYDROMORPHONE HCL 1 MG/ML IJ SOLN
0.2500 mg | INTRAMUSCULAR | Status: DC | PRN
  Administered 2015-06-28 (×3): 0.5 mg via INTRAVENOUS

## 2015-06-28 MED ORDER — METHOCARBAMOL 500 MG PO TABS: 500.0000 mg | ORAL_TABLET | Freq: Three times a day (TID) | ORAL | Status: DC | PRN

## 2015-06-28 MED ORDER — EPHEDRINE SULFATE 50 MG/ML IJ SOLN
INTRAMUSCULAR | Status: AC
  Filled 2015-06-28: qty 1

## 2015-06-28 MED ORDER — ASPIRIN EC 325 MG PO TBEC: 325.0000 mg | DELAYED_RELEASE_TABLET | Freq: Two times a day (BID) | ORAL | Status: DC

## 2015-06-28 MED ORDER — ONDANSETRON HCL 4 MG PO TABS: 4.0000 mg | ORAL_TABLET | Freq: Four times a day (QID) | ORAL | Status: DC | PRN

## 2015-06-28 MED ORDER — ONDANSETRON HCL 4 MG/2ML IJ SOLN
INTRAMUSCULAR | Status: DC | PRN
  Administered 2015-06-28: 4 mg via INTRAVENOUS

## 2015-06-28 SURGICAL SUPPLY — 59 items
APL SKNCLS STERI-STRIP NONHPOA (GAUZE/BANDAGES/DRESSINGS) ×1
BENZOIN TINCTURE PRP APPL 2/3 (GAUZE/BANDAGES/DRESSINGS) ×2 IMPLANT
BLADE SAW SGTL 18X1.27X75 (BLADE) ×2 IMPLANT
BLADE SURG ROTATE 9660 (MISCELLANEOUS) IMPLANT
BNDG COHESIVE 6X5 TAN STRL LF (GAUZE/BANDAGES/DRESSINGS) IMPLANT
BNDG GAUZE ELAST 4 BULKY (GAUZE/BANDAGES/DRESSINGS) IMPLANT
CAPT HIP TOTAL 2 ×1 IMPLANT
CELLS DAT CNTRL 66122 CELL SVR (MISCELLANEOUS) IMPLANT
CLSR STERI-STRIP ANTIMIC 1/2X4 (GAUZE/BANDAGES/DRESSINGS) ×1 IMPLANT
COVER PERINEAL POST (MISCELLANEOUS) ×2 IMPLANT
COVER SURGICAL LIGHT HANDLE (MISCELLANEOUS) ×2 IMPLANT
DRAPE C-ARM 42X72 X-RAY (DRAPES) ×2 IMPLANT
DRAPE STERI IOBAN 125X83 (DRAPES) ×2 IMPLANT
DRAPE U-SHAPE 47X51 STRL (DRAPES) ×4 IMPLANT
DRSG AQUACEL AG ADV 3.5X10 (GAUZE/BANDAGES/DRESSINGS) ×2 IMPLANT
DURAPREP 26ML APPLICATOR (WOUND CARE) ×2 IMPLANT
ELECT BLADE 4.0 EZ CLEAN MEGAD (MISCELLANEOUS)
ELECT CAUTERY BLADE 6.4 (BLADE) ×2 IMPLANT
ELECT REM PT RETURN 9FT ADLT (ELECTROSURGICAL) ×2
ELECTRODE BLDE 4.0 EZ CLN MEGD (MISCELLANEOUS) IMPLANT
ELECTRODE REM PT RTRN 9FT ADLT (ELECTROSURGICAL) ×1 IMPLANT
GAUZE XEROFORM 1X8 LF (GAUZE/BANDAGES/DRESSINGS) ×2 IMPLANT
GLOVE BIOGEL PI IND STRL 8 (GLOVE) ×2 IMPLANT
GLOVE BIOGEL PI INDICATOR 8 (GLOVE) ×2
GLOVE ECLIPSE 7.5 STRL STRAW (GLOVE) ×5 IMPLANT
GOWN STRL REUS W/ TWL LRG LVL3 (GOWN DISPOSABLE) ×2 IMPLANT
GOWN STRL REUS W/ TWL XL LVL3 (GOWN DISPOSABLE) ×2 IMPLANT
GOWN STRL REUS W/TWL LRG LVL3 (GOWN DISPOSABLE) ×4
GOWN STRL REUS W/TWL XL LVL3 (GOWN DISPOSABLE) ×4
HOOD PEEL AWAY FACE SHEILD DIS (HOOD) ×4 IMPLANT
KIT BASIN OR (CUSTOM PROCEDURE TRAY) ×2 IMPLANT
KIT ROOM TURNOVER OR (KITS) ×2 IMPLANT
MANIFOLD NEPTUNE II (INSTRUMENTS) ×2 IMPLANT
NDL SPNL 22GX3.5 QUINCKE BK (NEEDLE) ×1 IMPLANT
NEEDLE SPNL 22GX3.5 QUINCKE BK (NEEDLE) ×2 IMPLANT
NS IRRIG 1000ML POUR BTL (IV SOLUTION) ×2 IMPLANT
PACK TOTAL JOINT (CUSTOM PROCEDURE TRAY) ×2 IMPLANT
PACK UNIVERSAL I (CUSTOM PROCEDURE TRAY) ×2 IMPLANT
PAD ARMBOARD 7.5X6 YLW CONV (MISCELLANEOUS) ×4 IMPLANT
RETRACTOR WND ALEXIS 18 MED (MISCELLANEOUS) IMPLANT
RTRCTR WOUND ALEXIS 18CM MED (MISCELLANEOUS)
RTRCTR WOUND ALEXIS 18CM SML (INSTRUMENTS) ×2
SAVER CELL AAL HAEMONETICS (INSTRUMENTS) ×1 IMPLANT
SPONGE LAP 18X18 X RAY DECT (DISPOSABLE) IMPLANT
STAPLER VISISTAT 35W (STAPLE) IMPLANT
SUT ETHIBOND NAB CT1 #1 30IN (SUTURE) ×4 IMPLANT
SUT MNCRL AB 3-0 PS2 18 (SUTURE) IMPLANT
SUT VIC AB 0 CT1 27 (SUTURE) ×2
SUT VIC AB 0 CT1 27XBRD ANBCTR (SUTURE) ×1 IMPLANT
SUT VIC AB 1 CT1 27 (SUTURE) ×4
SUT VIC AB 1 CT1 27XBRD ANBCTR (SUTURE) ×2 IMPLANT
SUT VIC AB 2-0 CT1 27 (SUTURE) ×2
SUT VIC AB 2-0 CT1 TAPERPNT 27 (SUTURE) ×1 IMPLANT
SYR 50ML LL SCALE MARK (SYRINGE) ×2 IMPLANT
TOWEL OR 17X24 6PK STRL BLUE (TOWEL DISPOSABLE) ×2 IMPLANT
TOWEL OR 17X26 10 PK STRL BLUE (TOWEL DISPOSABLE) ×2 IMPLANT
TRAY CATH 16FR W/PLASTIC CATH (SET/KITS/TRAYS/PACK) IMPLANT
TRAY FOLEY CATH 16FR SILVER (SET/KITS/TRAYS/PACK) IMPLANT
WATER STERILE IRR 1000ML POUR (IV SOLUTION) ×4 IMPLANT

## 2015-06-28 NOTE — Care Management (Signed)
Utilization review completed. Avaree Gilberti, RN Case Manager [phone]. 

## 2015-06-28 NOTE — Op Note (Signed)
NAME:  [name], [name]:  [id number]  MEDICAL RECORD NO.:  [id number]  LOCATION:  [facility]                         FACILITY:  [facility]  PHYSICIAN:  Harvie Junior, M.D.   DATE OF BIRTH:  08-Nov-1980  DATE OF PROCEDURE:  06/28/2015 DATE OF DISCHARGE:                              OPERATIVE REPORT   PREOPERATIVE DIAGNOSIS:  Avascular necrosis with femoral head collapse, left.  POSTOPERATIVE DIAGNOSIS:  Avascular necrosis with femoral head collapse, left.  PROCEDURES: 1. Left total hip replacement from an anterior approach with a Corail     stem, size 12, 52-mm Pinnacle cup, +4 neutral liner, and a 36-mm +0     ceramic hip ball. 2. Interpretation of multiple intraoperative fluoroscopic images.  SURGEON:  Harvie Junior, M.D.  ASSISTANT:  Marshia Ly, P.A.  ANESTHESIA:  General.  BRIEF HISTORY:  Sarah Mcfarland is a 35 year old female with a history of having significant complaints of left hip pain.  We examined her in the office and noted to have an avascular necrosis of the femoral head with central collapse.  After discussion and failure of conservative care, she was taken to the operating room for total hip replacement.  We have discussed briefly the possibility of cord decompression versus vascularized free fibula.  After weighing her options, she felt that total hip replacement was the appropriate course of action.  We agreed, she was taken to the operating room for left total hip replacement.  DESCRIPTION OF PROCEDURE:  The patient was taken to the operating room. After adequate anesthesia was obtained with general anesthetic, the patient was placed supine on the operating table.  The left hip was prepped and draped in usual sterile fashion.  She was placed on the Hana bed prior to prep and drape.  At this point, after prep and drape, an incision was made for an anterior approach to the hip, subcutaneous tissue down the level of the tensor fascia, clearly  identified.  An incision was made in tensor fascia and it was finger dissected and retractors were put in place above and below the femoral neck.  Once this was completed, the hip capsule was opened and tagged, and traction was placed and the hip was then essentially dislocated with external rotation freeing up adhesions.  Once this was done, the provisional neck cut was made and the head ball removed, measured on the back table to 46.  At that point, we exposed the acetabulum, sequentially reamed the acetabulum to a level of 51 mm and put in a 52-mm Pinnacle cup with no holes.  Hole eliminator was placed and the +4 neutral liner was placed. Attention was then turned towards the stem side.  The hip was externally rotated and placed in an adducted position with the hip retractor in place.  Once this was completed, the hip canal was entered and we sequentially reamed the hip canal up to a size 12, used the calcar planer, put a +0 hip ball, reduced the hip.  Checked it under fluoro. Leg lengths were symmetric and reduced the hip, took out the broach, put in the final size 12 stem, excellent fit and fill was achieved, +  0 36-mm delta ceramic hip ball was placed and the hip re-reduced, final images were taken as well as pelvic imaging and leg lengths were symmetric at this point.  At this point, the wound was irrigated and suctioned dry. The capsule was closed anteriorly.  The tensor fascia was closed with 1 Vicryl running, 40 mL of 20 mL Exparel and 20 mL Marcaine were instilled all around the hip for postoperative anesthesia.  The patient, at this point, was taken to the recovery room after closure of the tensor fascia, subcutaneous tissue and the skin was closed with Monocryl subcuticular.  Benzoin and Steri-Strips were applied.  Sterile compressive dressing was applied.  The patient was taken to the recovery, she was noted to be in satisfactory condition.  Estimated blood loss for the  procedure was about 500 mL.  All the final counts can be gotten from the anesthetic record.  Complications of the procedure were none and the multiple intraoperative fluoroscopic images were taken to assess sizing and leg length.  Marshia LyJames Bethune assisted throughout the case, it was critical in his performance.     Harvie JuniorJohn L. Blease Capaldi, M.D.     Ranae PlumberJLG/MEDQ  D:  06/28/2015  T:  06/28/2015  Job:  [id number]

## 2015-06-28 NOTE — Anesthesia Procedure Notes (Signed)
Procedure Name: Intubation Date/Time: 06/28/2015 7:55 AM Performed by: Fransisca KaufmannMEYER, Rosetta Rupnow E Pre-anesthesia Checklist: Patient identified, Emergency Drugs available, Suction available, Patient being monitored and Timeout performed Patient Re-evaluated:Patient Re-evaluated prior to inductionOxygen Delivery Method: Circle system utilized Preoxygenation: Pre-oxygenation with 100% oxygen Intubation Type: IV induction Ventilation: Mask ventilation without difficulty Laryngoscope Size: Mac and 3 Grade View: Grade I Tube type: Oral Tube size: 7.5 mm Number of attempts: 1 Airway Equipment and Method: Stylet Placement Confirmation: ETT inserted through vocal cords under direct vision,  positive ETCO2 and breath sounds checked- equal and bilateral Secured at: 22 cm Tube secured with: Tape Dental Injury: Teeth and Oropharynx as per pre-operative assessment

## 2015-06-28 NOTE — Transfer of Care (Signed)
Immediate Anesthesia Transfer of Care Note  Patient: Sarah Mcfarland  Procedure(s) Performed: Procedure(s): TOTAL HIP ARTHROPLASTY ANTERIOR APPROACH (Left)  Patient Location: PACU  Anesthesia Type:General  Level of Consciousness: awake, alert , oriented and sedated  Airway & Oxygen Therapy: Patient Spontanous Breathing and Patient connected to nasal cannula oxygen  Post-op Assessment: Report given to RN, Post -op Vital signs reviewed and stable and Patient moving all extremities  Post vital signs: Reviewed and stable  Last Vitals:  Filed Vitals:   06/28/15 0608 06/28/15 1004  BP: 132/82 141/100  Pulse: 83 95  Temp: 36.8 C 36.6 C  Resp: 20 9    Complications: No apparent anesthesia complications

## 2015-06-28 NOTE — Evaluation (Signed)
Physical Therapy Evaluation Patient Details Name: Sarah Mcfarland MRN: [id number] DOB: [birth year] Today's Date: 06/28/2015   History of Present Illness  Patient is a 35 yo female admitted 06/28/15 with avascular necrosis, femoral head collapse Lt hip.  Patient s/p Lt THA with direct anterior approach with no hip precautions.   PMH:  anxiety, arthritis  Clinical Impression  Patient presents with problems listed below.  Will benefit from acute PT to maximize functional mobility prior to discharge home with husband.    Follow Up Recommendations Home health PT;Supervision for mobility/OOB    Equipment Recommendations  None recommended by PT    Recommendations for Other Services       Precautions / Restrictions Precautions Precautions: None Restrictions Weight Bearing Restrictions: Yes LLE Weight Bearing: Weight bearing as tolerated      Mobility  Bed Mobility Overal bed mobility: Needs Assistance Bed Mobility: Supine to Sit     Supine to sit: Supervision     General bed mobility comments: Supervision for safety.  Patient able to use RLE to assist LLE off of bed.  Transfers Overall transfer level: Needs assistance Equipment used: Rolling walker (2 wheeled) Transfers: Sit to/from Stand Sit to Stand: Min guard         General transfer comment: Verbal cues for hand placement.  Assist for safety from bed and [facility]  Ambulation/Gait Ambulation/Gait assistance: Min assist Ambulation Distance (Feet): 30 Feet (15' x 2) Assistive device: Rolling walker (2 wheeled) Gait Pattern/deviations: Step-to pattern;Decreased stance time - left;Decreased step length - right;Decreased weight shift to left;Decreased stride length;Antalgic Gait velocity: decreased Gait velocity interpretation: Below normal speed for age/gender General Gait Details: Verbal cues for safe use of RW and gait sequence.  Assist for balance and safety.  Distance limited due to patient feeling dizzy.  Resolved with sitting  and replacement of O2.  Stairs            Wheelchair Mobility    Modified Rankin (Stroke Patients Only)       Balance                                             Pertinent Vitals/Pain Pain Assessment: 0-10 Pain Score: 4  Pain Location: Lt hip Pain Descriptors / Indicators: Aching;Sore Pain Intervention(s): Limited activity within patient's tolerance;Premedicated before session;Monitored during session;Repositioned    Home Living Family/patient expects to be discharged to:: Private residence Living Arrangements: Spouse/significant other;Children Available Help at Discharge: Family;Available 24 hours/day Type of Home: House Home Access: Stairs to enter Entrance Stairs-Rails: None Entrance Stairs-Number of Steps: 1 Home Layout: One level Home Equipment: Walker - 2 wheels;Crutches;Bedside commode      Prior Function Level of Independence: Independent               Hand Dominance        Extremity/Trunk Assessment   Upper Extremity Assessment: Overall WFL for tasks assessed           Lower Extremity Assessment: LLE deficits/detail   LLE Deficits / Details: Decreased strength and ROM post-op  Cervical / Trunk Assessment: Normal  Communication   Communication: No difficulties  Cognition Arousal/Alertness: Awake/alert Behavior During Therapy: WFL for tasks assessed/performed Overall Cognitive Status: Within Functional Limits for tasks assessed                      General Comments  Exercises Total Joint Exercises Ankle Circles/Pumps: AROM;Both;10 reps;Seated      Assessment/Plan    PT Assessment Patient needs continued PT services  PT Diagnosis Difficulty walking;Acute pain   PT Problem List Decreased strength;Decreased activity tolerance;Decreased balance;Decreased mobility;Decreased knowledge of use of DME;Decreased knowledge of precautions;Cardiopulmonary status limiting activity;Pain  PT Treatment  Interventions DME instruction;Gait training;Functional mobility training;Therapeutic activities;Therapeutic exercise;Patient/family education   PT Goals (Current goals can be found in the Care Plan section) Acute Rehab PT Goals Patient Stated Goal: To return home tomorrow PT Goal Formulation: With patient/family Time For Goal Achievement: 07/05/15 Potential to Achieve Goals: Good    Frequency 7X/week   Barriers to discharge        Co-evaluation               End of Session Equipment Utilized During Treatment: Gait belt;Oxygen Activity Tolerance: Treatment limited secondary to medical complications (Comment) (Limited due to dizziness) Patient left: in chair;with call bell/phone within reach;with family/visitor present Nurse Communication: Mobility status         Time: [id number] PT Time Calculation (min) (ACUTE ONLY): 22 min   Charges:   PT Evaluation $PT Eval Moderate Complexity: 1 Procedure     PT G CodesVena Austria:        Tylin Stradley H 06/28/2015, 5:04 PM Durenda HurtSusan H. Renaldo Fiddleravis, PT, [facility] Acute Rehab Services Pager [phone]

## 2015-06-28 NOTE — Discharge Instructions (Signed)

## 2015-06-28 NOTE — Anesthesia Preprocedure Evaluation (Signed)
Anesthesia Evaluation  Patient identified by MRN, date of birth, ID band Patient awake    Reviewed: Allergy & Precautions, NPO status , Patient's Chart, lab work & pertinent test results  History of Anesthesia Complications (+) PONV  Airway Mallampati: II  TM Distance: >3 FB Neck ROM: Full    Dental   Pulmonary Current Smoker,    breath sounds clear to auscultation       Cardiovascular negative cardio ROS   Rhythm:Regular Rate:Normal     Neuro/Psych    GI/Hepatic negative GI ROS, Neg liver ROS,   Endo/Other  negative endocrine ROS  Renal/GU negative Renal ROS     Musculoskeletal   Abdominal   Peds  Hematology   Anesthesia Other Findings   Reproductive/Obstetrics                             Anesthesia Physical Anesthesia Plan  ASA: III  Anesthesia Plan: General   Post-op Pain Management:    Induction: Intravenous  Airway Management Planned: Oral ETT  Additional Equipment:   Intra-op Plan:   Post-operative Plan: Extubation in OR  Informed Consent: I have reviewed the patients History and Physical, chart, labs and discussed the procedure including the risks, benefits and alternatives for the proposed anesthesia with the patient or authorized representative who has indicated his/her understanding and acceptance.   Dental advisory given  Plan Discussed with: CRNA and Anesthesiologist  Anesthesia Plan Comments:         Anesthesia Quick Evaluation

## 2015-06-28 NOTE — Anesthesia Postprocedure Evaluation (Signed)
Anesthesia Post Note  Patient: Osa Cravernne Diniz  Procedure(s) Performed: Procedure(s) (LRB): TOTAL HIP ARTHROPLASTY ANTERIOR APPROACH (Left)  Patient location during evaluation: PACU Anesthesia Type: General Level of consciousness: awake Pain management: pain level controlled Vital Signs Assessment: post-procedure vital signs reviewed and stable Respiratory status: spontaneous breathing Cardiovascular status: stable Anesthetic complications: no    Last Vitals:  Filed Vitals:   06/28/15 0608 06/28/15 1004  BP: 132/82 141/100  Pulse: 83 95  Temp: 36.8 C 36.6 C  Resp: 20 9    Last Pain:  Filed Vitals:   06/28/15 1012  PainSc: 9                  EDWARDS,Howie Rufus

## 2015-06-28 NOTE — Brief Op Note (Signed)
06/28/2015  9:37 AM  PATIENT:  Sarah Mcfarland  35 y.o. female  PRE-OPERATIVE DIAGNOSIS:  Osteoarthritis left hip  POST-OPERATIVE DIAGNOSIS:  Osteoarthritis left hip  PROCEDURE:  Procedure(s): TOTAL HIP ARTHROPLASTY ANTERIOR APPROACH (Left)  SURGEON:  Surgeon(s) and Role:    * Jodi GeraldsJohn Cristobal Advani, MD - Primary  PHYSICIAN ASSISTANT:   ASSISTANTS: bethune   ANESTHESIA:   general  EBL:  Total I/O In: 1000 [I.V.:1000] Out: -   BLOOD ADMINISTERED:none  DRAINS: none   LOCAL MEDICATIONS USED:  OTHER experel  SPECIMEN:  No Specimen  DISPOSITION OF SPECIMEN:  N/A  COUNTS:  YES  TOURNIQUET:  * No tourniquets in log *  DICTATION: .Other Dictation: Dictation Number [id number]  PLAN OF CARE: Admit to inpatient   PATIENT DISPOSITION:  PACU - hemodynamically stable.   Delay start of Pharmacological VTE agent (>24hrs) due to surgical blood loss or risk of bleeding: no

## 2015-06-29 DIAGNOSIS — D62 Acute posthemorrhagic anemia: Secondary | ICD-10-CM

## 2015-06-29 LAB — CBC
HCT: 25 % — ABNORMAL LOW (ref 36.0–46.0)
HEMOGLOBIN: 8.5 g/dL — AB (ref 12.0–15.0)
MCH: 30.6 pg (ref 26.0–34.0)
MCHC: 34 g/dL (ref 30.0–36.0)
MCV: 89.9 fL (ref 78.0–100.0)
Platelets: 108 10*3/uL — ABNORMAL LOW (ref 150–400)
RBC: 2.78 MIL/uL — ABNORMAL LOW (ref 3.87–5.11)
RDW: 12.1 % (ref 11.5–15.5)
WBC: 8 10*3/uL (ref 4.0–10.5)

## 2015-06-29 LAB — BASIC METABOLIC PANEL
Anion gap: 7 (ref 5–15)
CHLORIDE: 104 mmol/L (ref 101–111)
CO2: 22 mmol/L (ref 22–32)
CREATININE: 0.66 mg/dL (ref 0.44–1.00)
Calcium: 8 mg/dL — ABNORMAL LOW (ref 8.9–10.3)
GFR calc Af Amer: >60 mL/min (ref >60)
GFR calc non Af Amer: >60 mL/min (ref >60)
Glucose, Bld: 126 mg/dL — ABNORMAL HIGH (ref 65–99)
Potassium: 3.5 mmol/L (ref 3.5–5.1)
SODIUM: 133 mmol/L — AB (ref 135–145)

## 2015-06-29 NOTE — Care Management Note (Signed)
Case Management Note  Patient Details  Name: Osa Cravernne Danford MRN: [id number] Date of Birth: May 05, 1980  Subjective/Objective:    35 yr old female s/p left total hip arthroplasty.                Action/Plan: Case manager spoke with patient and husband concerning home health and DME needs. Patient states she doesn't want to have HHPT, if she needs it she will contact her Dr. For outpatient therapy. She has rolling walker and 3in1 already, will have family support at discharge. No further Case manager needs identified.   Expected Discharge Date:    06/29/15              Expected Discharge Plan:  Home/Self Care  In-House Referral:  NA  Discharge planning Services  CM Consult  Post Acute Care Choice:    Choice offered to:  NA  DME Arranged:  N/A DME Agency:  NA  HH Arranged:  NA HH Agency:  NA  Status of Service:  Completed, signed off  Medicare Important Message Given:    Date Medicare IM Given:    Medicare IM give by:    Date Additional Medicare IM Given:    Additional Medicare Important Message give by:     If discussed at Long Length of Stay Meetings, dates discussed:    Additional Comments:  Durenda GuthrieBrady, Flavia Bruss Naomi, RN 06/29/2015, 11:00 AM

## 2015-06-29 NOTE — Progress Notes (Signed)
Physical Therapy Treatment Patient Details Name: Sarah Mcfarland MRN: [id number] DOB: 05-30-1980 Today's Date: 06/29/2015    History of Present Illness Patient is a 35 yo female admitted 06/28/15 with avascular necrosis, femoral head collapse Lt hip.  Patient s/p Lt THA with direct anterior approach with no hip precautions.   PMH:  anxiety, arthritis    PT Comments    Patient is making good progress with PT.  From a mobility standpoint anticipate patient will be ready for DC home when medically ready.     Follow Up Recommendations  Home health PT;Supervision for mobility/OOB     Equipment Recommendations  None recommended by PT    Recommendations for Other Services       Precautions / Restrictions Precautions Precautions: None Restrictions Weight Bearing Restrictions: Yes LLE Weight Bearing: Weight bearing as tolerated    Mobility  Bed Mobility Overal bed mobility: Needs Assistance Bed Mobility: Supine to Sit;Sit to Supine     Supine to sit: Supervision     General bed mobility comments: supervision for safety; educated on technique and sequencing; HOB flat and no use of bedrails; bed elevated to simulate home  Transfers Overall transfer level: Needs assistance Equipment used: Rolling walker (2 wheeled)   Sit to Stand: Min guard         General transfer comment: Verbal cues for hand placement.  Assist for safety [facility] bed and [facility]  Ambulation/Gait Ambulation/Gait assistance: Supervision Ambulation Distance (Feet): 250 Feet Assistive device: Rolling walker (2 wheeled) Gait Pattern/deviations: Step-through pattern;Wide base of support;Antalgic Gait velocity: decreased   General Gait Details: pt with improved ability to WB on L LE; cues for position of RW; no c/o dizziness; pt reported that it "feels good to be up on it" (L hip)   Stairs            Wheelchair Mobility    Modified Rankin (Stroke Patients Only)       Balance Overall balance assessment:  Needs assistance Sitting-balance support: Feet unsupported;No upper extremity supported Sitting balance-Leahy Scale: Good     Standing balance support: No upper extremity supported Standing balance-Leahy Scale: Fair Standing balance comment: able to static stand without UE support                    Cognition Arousal/Alertness: Awake/alert Behavior During Therapy: WFL for tasks assessed/performed Overall Cognitive Status: Within Functional Limits for tasks assessed                      Exercises Total Joint Exercises Ankle Circles/Pumps: AROM;Both;10 reps;Seated Quad Sets: AROM;Both;10 reps;Seated Gluteal Sets: AROM;Both;10 reps;Seated Heel Slides: AROM;Left;10 reps;Seated Hip ABduction/ADduction: AAROM;AROM;Left;10 reps;Seated Long Arc Quad: AROM;Left;10 reps;Seated    General Comments General comments (skin integrity, edema, etc.): significant other present for session and very supportive; educated on HEP, given and reviewed handout, and use of ice      Pertinent Vitals/Pain Pain Assessment: Faces Faces Pain Scale: Hurts little more Pain Location: L hip Pain Descriptors / Indicators: Sore;Tightness Pain Intervention(s): Limited activity within patient's tolerance;Monitored during session;RN gave pain meds during session;Repositioned    Home Living                      Prior Function            PT Goals (current goals can now be found in the care plan section) Acute Rehab PT Goals Patient Stated Goal: To return home tomorrow PT Goal Formulation: With  patient/family Time For Goal Achievement: 07/05/15 Potential to Achieve Goals: Good Progress towards PT goals: Progressing toward goals    Frequency  7X/week    PT Plan Current plan remains appropriate    Co-evaluation             End of Session Equipment Utilized During Treatment: Gait belt Activity Tolerance: Patient tolerated treatment well (Limited due to dizziness) Patient  left: with call bell/phone within reach;with family/visitor present;in bed (sitting on EOB with significant other)     Time: 1914-7829 PT Time Calculation (min) (ACUTE ONLY): 23 min  Charges:  $Gait Training: 8-22 mins $Therapeutic Exercise: 8-22 mins                    G Codes:      Derek Mound, PTA Pager: [phone]   06/29/2015, 10:28 AM

## 2015-06-29 NOTE — Progress Notes (Signed)
Subjective: 1 Day Post-Op Procedure(s) (LRB): TOTAL HIP ARTHROPLASTY ANTERIOR APPROACH (Left) Patient reports pain as mild.  Taking by mouth and voiding okay. No dizziness. She reports that she had her menstrual period just after she had her initial blood work done 12 days ago.  Objective: Vital signs in last 24 hours: Temp:  [97.9 F (36.6 C)-99.8 F (37.7 C)] 99 F (37.2 C) (03/04 0454) Pulse Rate:  [66-96] 96 (03/04 0454) Resp:  [8-17] 16 (03/04 0454) BP: (106-162)/(54-101) 116/54 mmHg (03/04 0454) SpO2:  [98 %-100 %] 98 % (03/04 0454)  Intake/Output from previous day: 03/03 0701 - 03/04 0700 In: 2760 [P.O.:360; I.V.:2300; IV Piggyback:100] Out: 1050 [Urine:750; Blood:300] Intake/Output this shift:     Recent Labs  06/29/15 0534  HGB 8.5*    Recent Labs  06/29/15 0534  WBC 8.0  RBC 2.78*  HCT 25.0*  PLT 108*    Recent Labs  06/29/15 0534  NA 133*  K 3.5  CL 104  CO2 22  BUN <5*  CREATININE 0.66  GLUCOSE 126*  CALCIUM 8.0*   No results for input(s): LABPT, INR in the last 72 hours. Left hip exam: Neurovascular intact Sensation intact distally Intact pulses distally Dorsiflexion/Plantar flexion intact Incision: dressing C/D/I Compartment soft  Assessment/Plan: 1 Day Post-Op Procedure(s) (LRB): TOTAL HIP ARTHROPLASTY ANTERIOR APPROACH (Left) Acute blood loss anemia, expected. Asymptomatic. Plan: Up with therapy Discharge home with home health Aspirin 325 mg twice daily for DVT prophylaxis 1 month. Percocet as needed for pain Robaxin as needed for spasm Follow-up with Dr. Luiz BlareGraves in 2 weeks.  Zaynah Chawla G 06/29/2015, 8:39 AM

## 2015-06-29 NOTE — Progress Notes (Signed)
Occupational Therapy Evaluation Patient Details Name: Sarah Mcfarland MRN: [id number] DOB: July 15, 1980 Today's Date: 06/29/2015    History of Present Illness Patient is a 35 y.o. female admitted 06/28/15 with avascular necrosis, femoral head collapse Lt hip.  Patient s/p Lt THA with direct anterior approach with no hip precautions.   PMH:  anxiety, arthritis, PONV, left shoulder surgery.    Clinical Impression   Pt s/p above. Education provided in session and pt verbalized understanding. OT signing off.    Follow Up Recommendations  No OT follow up;Supervision - Intermittent    Equipment Recommendations  None recommended by OT    Recommendations for Other Services       Precautions / Restrictions Precautions Precautions: reports MD told her no swinging left leg into extension Restrictions Weight Bearing Restrictions: Yes LLE Weight Bearing: Weight bearing as tolerated      Mobility Bed Mobility     General bed mobility comments: pt sitting EOB  Transfers Overall transfer level: Needs assistance Transfers: Sit to/from Stand Sit to Stand: Supervision          Balance Overall balance assessment: Needs assistance  Simulated functional task while standing in single leg stance and pt unsteady-Min guard given.                        ADL Overall ADL's : Needs assistance/impaired             Lower Body Bathing: Set up;Supervison/ safety;Sit to/from stand;With adaptive equipment Lower Body Bathing Details (indicate cue type and reason): simulated while standing with single leg stance to lift foot but pt unsteady. Set up/Supervision/safety for LB bathing while sitting and sit to stand.      Lower Body Dressing: Sit to/from stand;Minimal assistance   Toilet Transfer: Min guard;Ambulation (sit to stand from bed)           Functional mobility during ADLs: Min guard General ADL Comments: Practiced with reacher. Educated on AE. Educated on LB dressing  technique. Educated on safety such as sitting for LB ADLs, recommended spouse be with her for shower transfer and bathing. Discussed what she could use for shower chair at home. Recommended pt hold to steady part of shower when stepping in. Recommended her clarify with MD about hip extension-talked with her about how walking the left leg naturally extends and how she could prevent it.     Vision     Perception     Praxis      Pertinent Vitals/Pain Pain Assessment: 0-10 Pain Score:  (2-3) Pain Location: left hip Pain Intervention(s): Monitored during session     Hand Dominance     Extremity/Trunk Assessment Upper Extremity Assessment Upper Extremity Assessment: Overall WFL for tasks assessed   Lower Extremity Assessment Lower Extremity Assessment: Defer to PT evaluation       Communication Communication Communication: No difficulties   Cognition Arousal/Alertness: Awake/alert Behavior During Therapy: WFL for tasks assessed/performed Overall Cognitive Status: Within Functional Limits for tasks assessed                     General Comments          Shoulder Instructions      Home Living Family/patient expects to be discharged to:: Private residence Living Arrangements: Spouse/significant other;Children Available Help at Discharge: Family;Available 24 hours/day Type of Home: House Home Access: Stairs to enter Enter[employer] CorporationEntrance Stairs-Number of Steps: 1 Entrance Stairs-Rails: None Home Layout: One level  Bathroom Shower/Tub: Runner, broadcasting/film/video: Environmental consultant - 2 wheels;Crutches;Bedside commode;Adaptive equipment Adaptive Equipment: Reacher (long handled brush)        Prior Functioning/Environment Level of Independence: Independent             OT Diagnosis: Acute pain   OT Problem List:     OT Treatment/Interventions:      OT Goals(Current goals can be found in the care plan section)   OT Frequency:     Barriers to D/C:             Co-evaluation              End of Session Equipment Utilized During Treatment: Gait belt  Activity Tolerance: Patient tolerated treatment well Patient left: in bed;with family/visitor present   Time: 1610-9604 OT Time Calculation (min): 11 min Charges:  OT General Charges $OT Visit: 1 Procedure OT Evaluation $OT Eval Low Complexity: 1 Procedure G-CodesEarlie Raveling OTR/L [phone] 06/29/2015, 10:56 AM

## 2015-06-29 NOTE — Discharge Summary (Signed)
Patient ID: Sarah Mcfarland Rosenbloom MRN: [id number] DOB/AGE: December 25, 1980 35 y.o.  Admit date: 06/28/2015 Discharge date: 06/29/2015  Admission Diagnoses:  Principal Problem:   Primary osteoarthritis of left hip Active Problems:   Postoperative anemia due to acute blood loss   Discharge Diagnoses:  Same  Past Medical History  Diagnosis Date  . PONV (postoperative nausea and vomiting)   . Oral allergy syndrome   . Anxiety   . Arthritis   . History of anemia     during pregnancy    Surgeries: Procedure(s):Left TOTAL HIP ARTHROPLASTY ANTERIOR APPROACH on 06/28/2015   Discharged Condition: Improved  Hospital Course: Sarah Mcfarland Every is an 35 y.o. female who was admitted 06/28/2015 for operative treatment ofPrimary osteoarthritis of left hip. Patient has severe unremitting pain that affects sleep, daily activities, and work/hobbies. After pre-op clearance the patient was taken to the operating room on 06/28/2015 and underwent  Procedure(s): Left TOTAL HIP ARTHROPLASTY ANTERIOR APPROACH.    Patient was given perioperative antibiotics: Anti-infectives    Start     Dose/Rate Route Frequency Ordered Stop   06/28/15 1415  ceFAZolin (ANCEF) IVPB 2 g/50 mL premix     2 g 100 mL/hr over 30 Minutes Intravenous Every 6 hours 06/28/15 1404 06/28/15 2046   06/28/15 0600  ceFAZolin (ANCEF) IVPB 2 g/50 mL premix     2 g 100 mL/hr over 30 Minutes Intravenous To [facility] 06/27/15 1108 06/28/15 0758       Patient was given sequential compression devices, early ambulation, and chemoprophylaxis to prevent DVT.The patient did well postoperatively. She had no dizziness. She did report that since her blood work she had her menstrual period which may be the source of her decreased hemoglobin. She reports that she has taken oral iron in the past and will get some to take 1 daily after discharge.  Patient benefited maximally from hospital stay and there were no complications.    Recent vital signs: Patient Vitals  for the past 24 hrs:  BP Temp Temp src Pulse Resp SpO2  06/29/15 0454 (!) 116/54 mmHg 99 F (37.2 C) Oral 96 16 98 %  06/29/15 0113 (!) 106/57 mmHg 99.8 F (37.7 C) Oral 76 16 100 %  06/28/15 2235 116/67 mmHg 98.5 F (36.9 C) Oral 78 16 100 %  06/28/15 1354 117/75 mmHg 98.6 F (37 C) Oral 72 14 100 %  06/28/15 1330 117/81 mmHg 98.6 F (37 C) - 70 14 100 %  06/28/15 1300 - - - 74 14 100 %  06/28/15 1230 (!) 146/69 mmHg - - 66 14 100 %  06/28/15 1200 125/84 mmHg - - 96 (!) 8 100 %  06/28/15 1130 131/84 mmHg - - 83 17 100 %  06/28/15 1115 (!) 142/96 mmHg - - 69 13 100 %  06/28/15 1100 (!) 149/94 mmHg - - 66 16 100 %  06/28/15 1045 (!) 162/91 mmHg - - 86 10 100 %  06/28/15 1030 (!) 151/99 mmHg - - 78 13 100 %  06/28/15 1015 (!) 149/101 mmHg - - 76 11 100 %  06/28/15 1004 (!) 141/100 mmHg 97.9 F (36.6 C) - 95 (!) 9 100 %     Recent laboratory studies:  Recent Labs  06/29/15 0534  WBC 8.0  HGB 8.5*  HCT 25.0*  PLT 108*  NA 133*  K 3.5  CL 104  CO2 22  BUN <5*  CREATININE 0.66  GLUCOSE 126*  CALCIUM 8.0*     Discharge Medications:  Medication List    TAKE these medications        aspirin EC 325 MG tablet  Take 1 tablet (325 mg total) by mouth 2 (two) times daily after a meal. Take x 1 month post op to decrease risk of blood clots.     methocarbamol 500 MG tablet  Commonly known as:  ROBAXIN  Take 1 tablet (500 mg total) by mouth every 8 (eight) hours as needed for muscle spasms.     oxyCODONE-acetaminophen 5-325 MG tablet  Commonly known as:  PERCOCET/ROXICET  Take 1-2 tablets by mouth every 6 (six) hours as needed for severe pain.     traMADol 50 MG tablet  Commonly known as:  ULTRAM  Take 50 mg by mouth daily.        Diagnostic Studies: Dg Chest 2 View  06/17/2015  CLINICAL DATA:  Preoperative exam prior to left total hip joint replacement, current smoker. EXAM: CHEST  2 VIEW COMPARISON:  None in PACs FINDINGS: The lungs are well-expanded and clear.  The heart and pulmonary vascularity are normal. The mediastinum is normal in width. There is no pleural effusion. The bony thorax exhibits no acute abnormality. IMPRESSION: There is no active cardiopulmonary disease. Electronically Signed   By: David  [place] M.D.   On: 06/17/2015 16:04   Dg Hip Operative Unilat With Pelvis Left  06/28/2015  CLINICAL DATA:  Left hip arthroplasty EXAM: OPERATIVE left HIP (WITH PELVIS IF PERFORMED) 2 VIEWS TECHNIQUE: Fluoroscopic spot image(s) were submitted for interpretation post-operatively. FLUOROSCOPY TIME:  23 seconds COMPARISON:  None. FINDINGS: Two intraoperative radiographs during left hip arthroplasty. IMPRESSION: Intraoperative radiographs during left hip arthroplasty. Electronically Signed   By: Charline Bills M.D.   On: 06/28/2015 09:35    Disposition: 01-Home or Self Care      Discharge Instructions    Call MD / Call 911    Complete by:  As directed   If you experience chest pain or shortness of breath, CALL 911 and be transported to the hospital emergency room.  If you develope a fever above 101 F, pus (white drainage) or increased drainage or redness at the wound, or calf pain, call your surgeon's office.     Constipation Prevention    Complete by:  As directed   Drink plenty of fluids.  Prune juice may be helpful.  You may use a stool softener, such as Colace (over the counter) 100 mg twice a day.  Use MiraLax (over the counter) for constipation as needed.     Diet general    Complete by:  As directed      Discharge wound care:    Complete by:  As directed   If you have a hip bandage, keep it clean and dry.  Change your bandage as instructed by your health care providers.  If your bandage has been discontinued, keep your incision clean and dry.  Pat dry after bathing.  DO NOT put lotion or powder on your incision.     Do not sit on low chairs, stoools or toilet seats, as it may be difficult to get up from low surfaces    Complete by:  As  directed      Increase activity slowly as tolerated    Complete by:  As directed      Weight bearing as tolerated    Complete by:  As directed   Laterality:  left  Extremity:  Lower  Follow-up Information    Follow up with GRAVES,JOHN L, MD. Schedule an appointment as soon as possible for a visit in 2 weeks.   Specialty:  Orthopedic Surgery   Contact information:   [address] [place] [address] [phone]        Signed: Matthew Folks 06/29/2015, 8:46 AM

## 2015-06-29 NOTE — Progress Notes (Signed)
Discharge instructions gave to patient and his husband and all questions answered. Patient is ready to discharge.

## 2015-07-01 ENCOUNTER — Encounter (HOSPITAL_COMMUNITY): Payer: Self-pay | Admitting: Orthopedic Surgery

## 2015-07-29 DIAGNOSIS — M25552 Pain in left hip: Secondary | ICD-10-CM | POA: Diagnosis not present

## 2015-07-29 DIAGNOSIS — Z96642 Presence of left artificial hip joint: Secondary | ICD-10-CM | POA: Diagnosis not present

## 2015-08-06 DIAGNOSIS — M25552 Pain in left hip: Secondary | ICD-10-CM | POA: Diagnosis not present

## 2015-08-06 DIAGNOSIS — Z96642 Presence of left artificial hip joint: Secondary | ICD-10-CM | POA: Diagnosis not present

## 2015-08-12 DIAGNOSIS — Z96642 Presence of left artificial hip joint: Secondary | ICD-10-CM | POA: Diagnosis not present

## 2015-08-12 DIAGNOSIS — M25552 Pain in left hip: Secondary | ICD-10-CM | POA: Diagnosis not present

## 2015-08-15 DIAGNOSIS — Z471 Aftercare following joint replacement surgery: Secondary | ICD-10-CM | POA: Diagnosis not present

## 2015-08-15 DIAGNOSIS — Z96642 Presence of left artificial hip joint: Secondary | ICD-10-CM | POA: Diagnosis not present

## 2015-10-22 DIAGNOSIS — Z96642 Presence of left artificial hip joint: Secondary | ICD-10-CM | POA: Diagnosis not present

## 2016-08-04 DIAGNOSIS — G245 Blepharospasm: Secondary | ICD-10-CM | POA: Diagnosis not present

## 2016-09-20 DIAGNOSIS — S20219A Contusion of unspecified front wall of thorax, initial encounter: Secondary | ICD-10-CM | POA: Diagnosis not present

## 2016-09-20 DIAGNOSIS — R05 Cough: Secondary | ICD-10-CM | POA: Diagnosis not present

## 2016-10-12 DIAGNOSIS — M25552 Pain in left hip: Secondary | ICD-10-CM | POA: Diagnosis not present

## 2016-12-24 DIAGNOSIS — T781XXA Other adverse food reactions, not elsewhere classified, initial encounter: Secondary | ICD-10-CM | POA: Diagnosis not present

## 2016-12-24 DIAGNOSIS — J3081 Allergic rhinitis due to animal (cat) (dog) hair and dander: Secondary | ICD-10-CM | POA: Diagnosis not present

## 2016-12-24 DIAGNOSIS — K210 Gastro-esophageal reflux disease with esophagitis: Secondary | ICD-10-CM | POA: Diagnosis not present

## 2016-12-24 DIAGNOSIS — J3089 Other allergic rhinitis: Secondary | ICD-10-CM | POA: Diagnosis not present

## 2017-01-14 ENCOUNTER — Other Ambulatory Visit: Payer: Self-pay | Admitting: Gastroenterology

## 2017-01-14 DIAGNOSIS — K219 Gastro-esophageal reflux disease without esophagitis: Secondary | ICD-10-CM | POA: Diagnosis not present

## 2017-01-14 DIAGNOSIS — J329 Chronic sinusitis, unspecified: Secondary | ICD-10-CM

## 2017-01-14 DIAGNOSIS — Z91018 Allergy to other foods: Secondary | ICD-10-CM | POA: Diagnosis not present

## 2017-01-14 DIAGNOSIS — K582 Mixed irritable bowel syndrome: Secondary | ICD-10-CM | POA: Diagnosis not present

## 2017-01-14 DIAGNOSIS — R1110 Vomiting, unspecified: Secondary | ICD-10-CM | POA: Diagnosis not present

## 2017-01-14 NOTE — Progress Notes (Signed)
Sarah Willmore MD 

## 2017-01-17 IMAGING — CR DG CHEST 2V
2 series · 2 of 2 positions shown · non-contrast
Comparison: None in PACs

CLINICAL DATA: Preoperative exam prior to left total hip joint
replacement, current smoker.

EXAM:
CHEST  2 VIEW

[w chest pa]
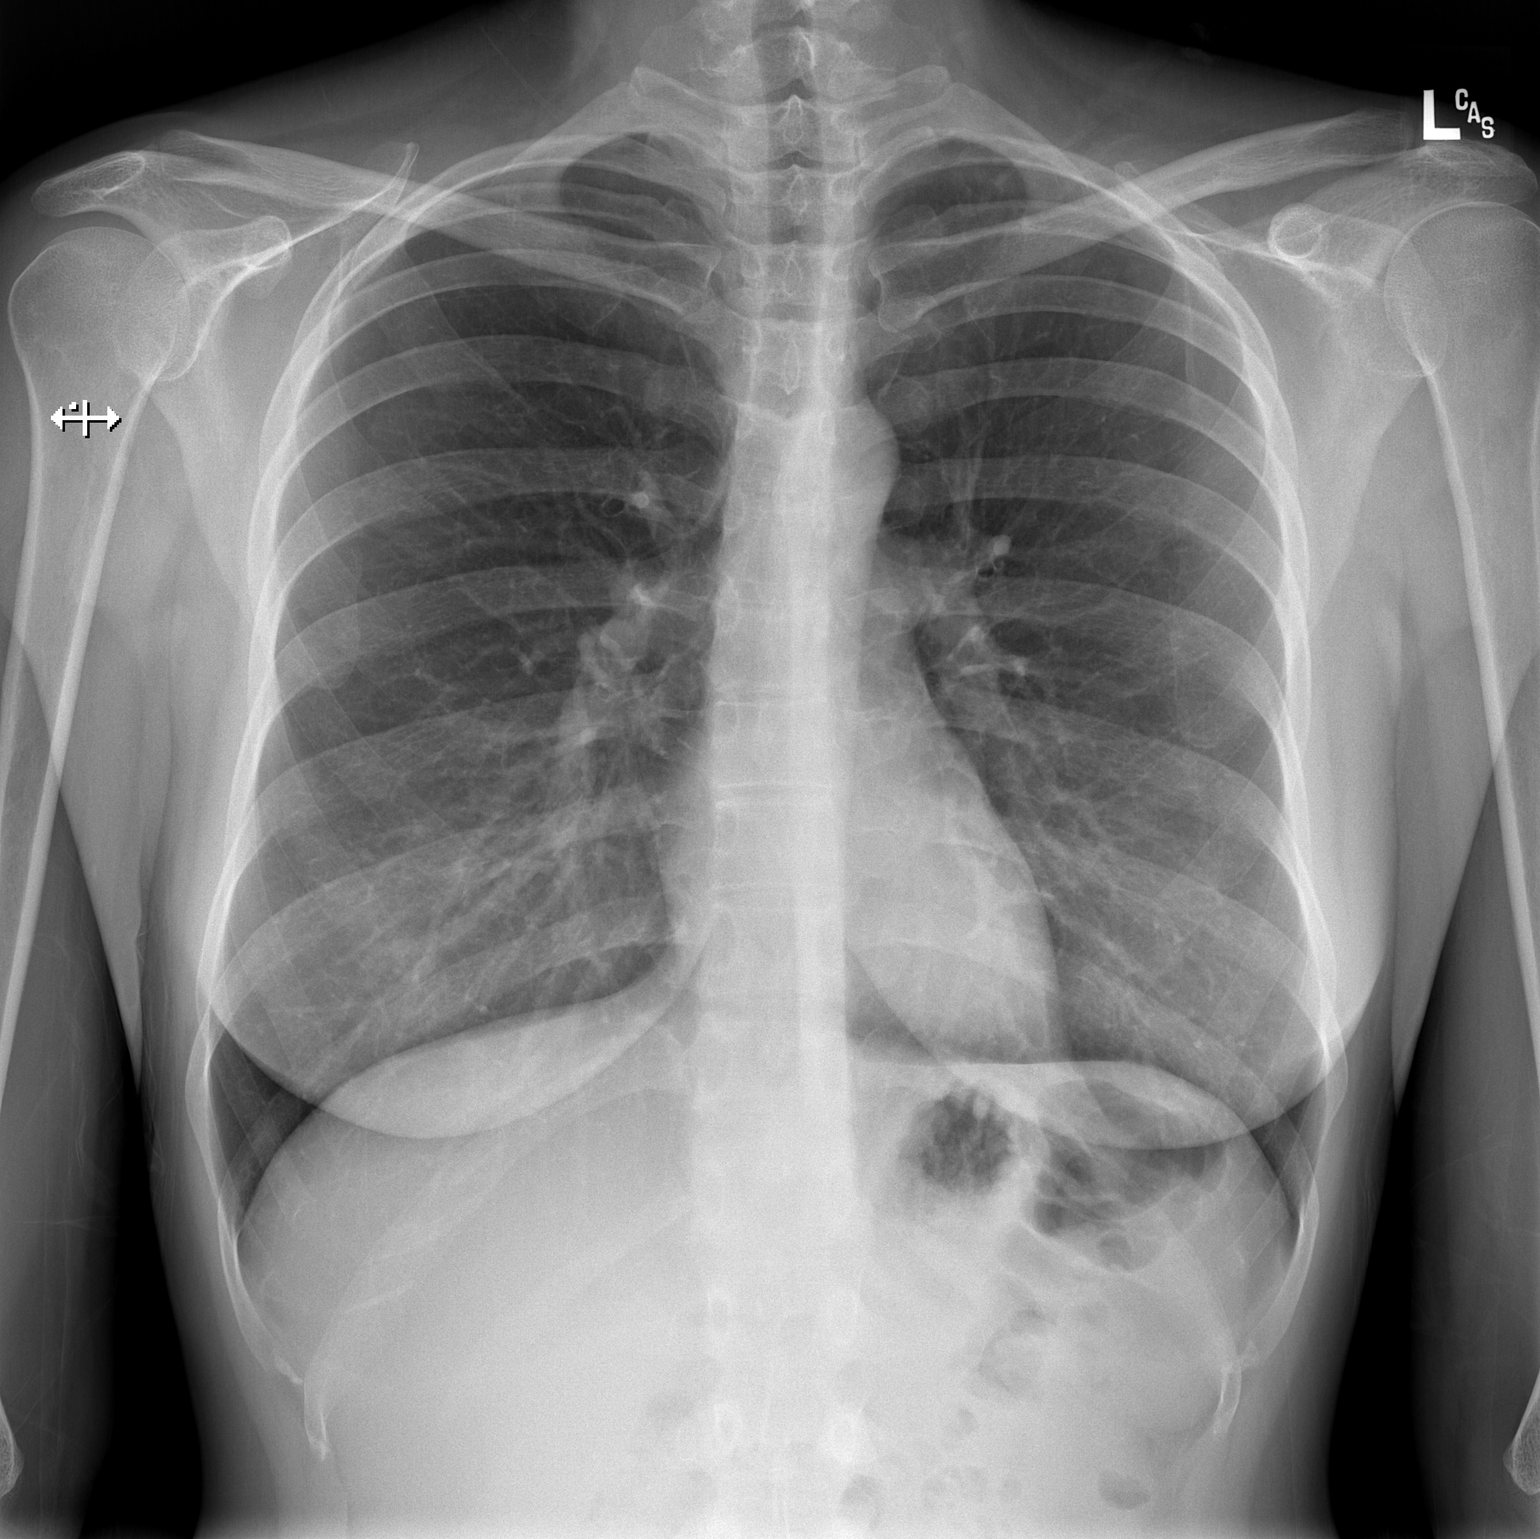

[w chest lat]
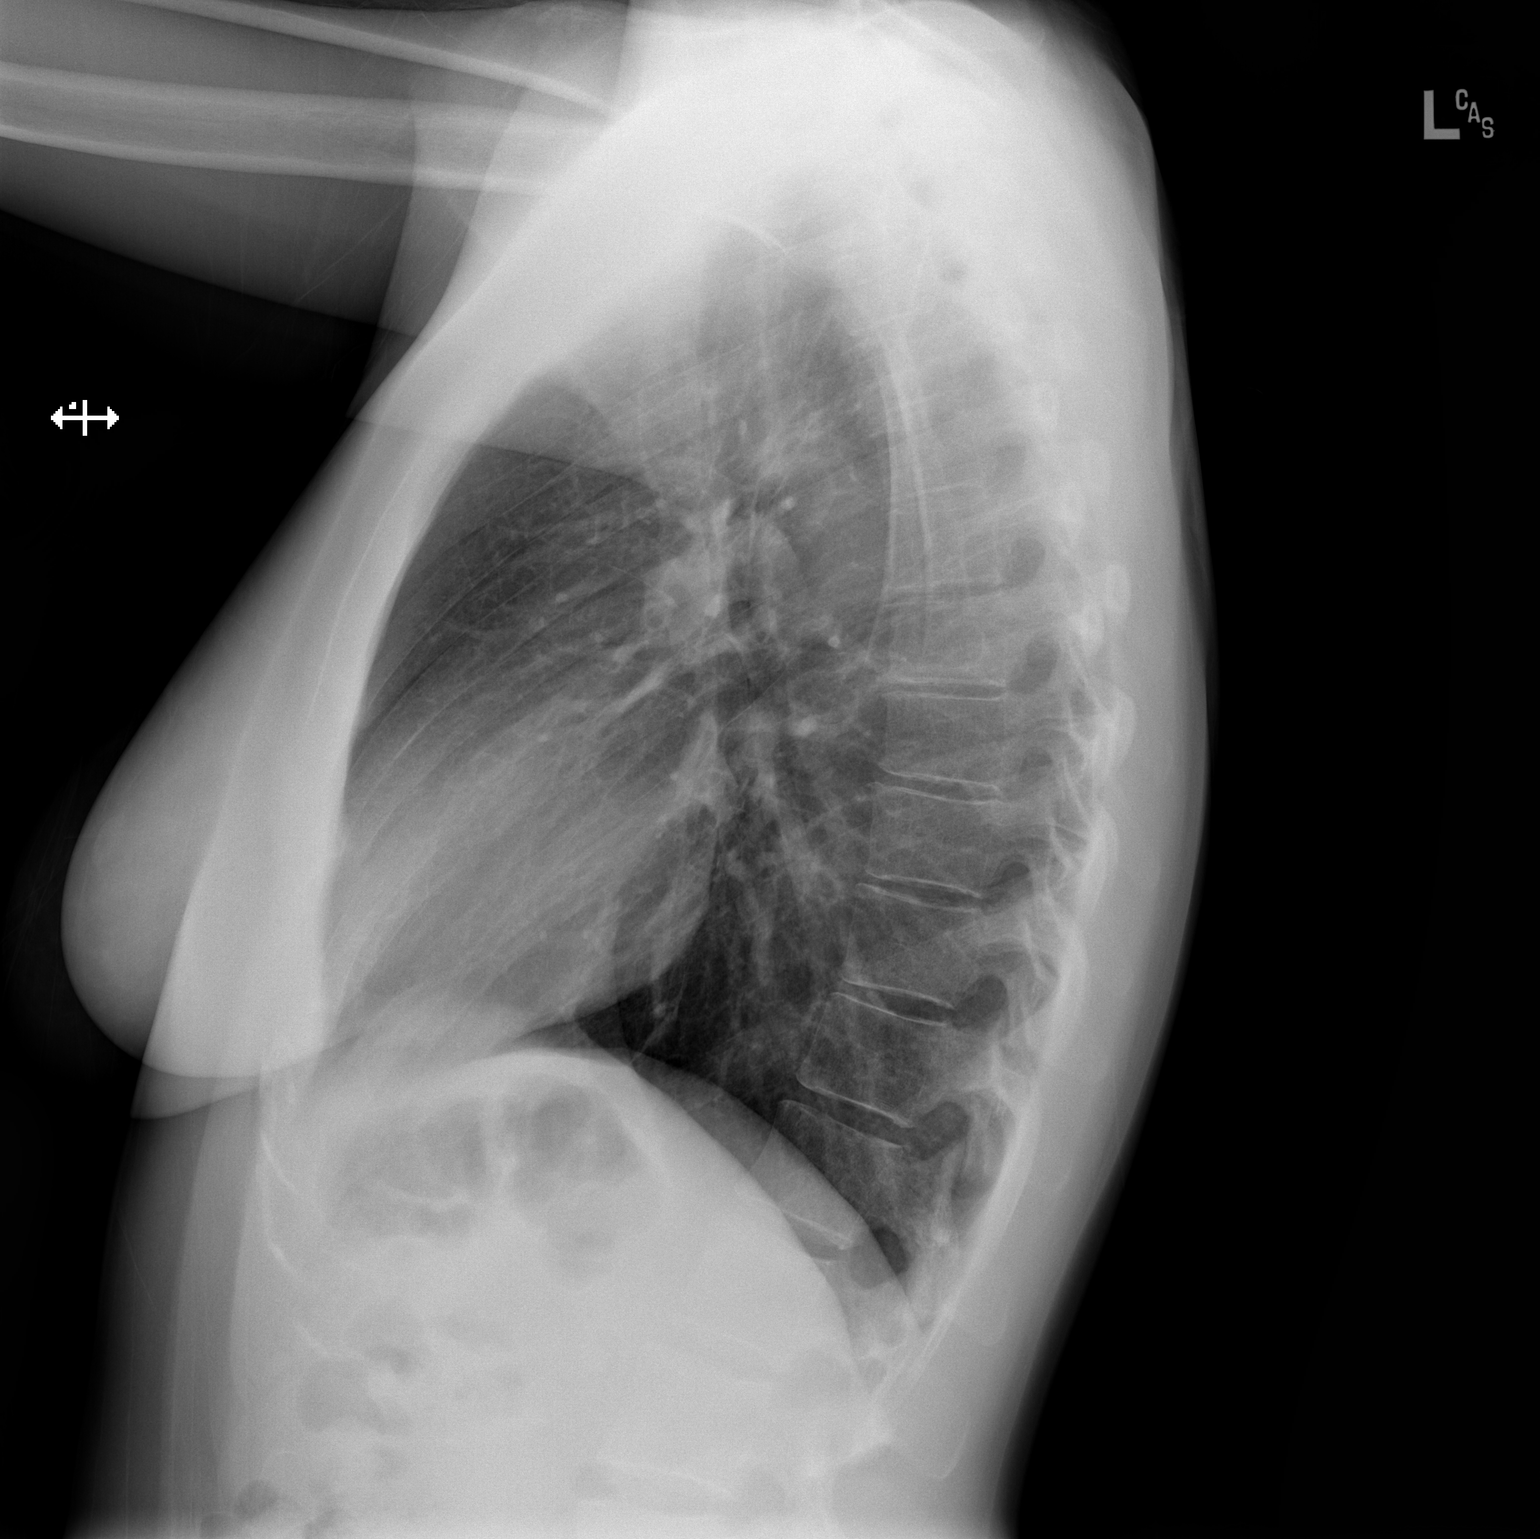

[2 of 2 positions shown; findings below may reference images not displayed]

FINDINGS: The lungs are well-expanded and clear. The heart and pulmonary
vascularity are normal. The mediastinum is normal in width. There is
no pleural effusion. The bony thorax exhibits no acute abnormality.
IMPRESSION: There is no active cardiopulmonary disease.

## 2017-03-11 DIAGNOSIS — F419 Anxiety disorder, unspecified: Secondary | ICD-10-CM | POA: Diagnosis not present

## 2017-03-11 DIAGNOSIS — F322 Major depressive disorder, single episode, severe without psychotic features: Secondary | ICD-10-CM | POA: Diagnosis not present

## 2017-03-11 DIAGNOSIS — Z23 Encounter for immunization: Secondary | ICD-10-CM | POA: Diagnosis not present

## 2017-05-18 DIAGNOSIS — F419 Anxiety disorder, unspecified: Secondary | ICD-10-CM | POA: Diagnosis not present

## 2017-05-18 DIAGNOSIS — F322 Major depressive disorder, single episode, severe without psychotic features: Secondary | ICD-10-CM | POA: Diagnosis not present

## 2017-06-15 ENCOUNTER — Encounter: Payer: Self-pay | Admitting: Dietician

## 2017-06-15 ENCOUNTER — Encounter: Payer: BLUE CROSS/BLUE SHIELD | Attending: Physician Assistant | Admitting: Dietician

## 2017-06-15 DIAGNOSIS — Z91018 Allergy to other foods: Secondary | ICD-10-CM | POA: Diagnosis not present

## 2017-06-15 DIAGNOSIS — Z713 Dietary counseling and surveillance: Secondary | ICD-10-CM | POA: Diagnosis not present

## 2017-06-15 NOTE — Progress Notes (Signed)
Medical Nutrition Therapy:  Appt start time: 1410 end time:  1525.   Assessment:  Primary concerns today: Patient is here today alone due to concerns of multiple food allergies including:  Tree nuts, [place], rye barley, spinach, squash soy, sesame, cantaloupe, grapes, mustard, celery, coconut, eggplant, apples, and peaches.   Some allergies are proven with symptoms and others were on the skin tests and blood tests.  She states that her food allergies increased again in August.  She reports autoimmune allergy and oral allergy syndrome.  She was on steroids for 2 years in the past and devleoped Avascular Necrosis requiring hip replacement in the past.  She is fearful of this again and states that she needs to rotate her foods to prevent further food allergies.  Patient lives with her husband and 59 yo daughter and 76 yo son.  She does the shopping and cooking.  She has been avoiding all soy including soy oil.  She reads labels in detail.  She has found many food products that she can use without the allergens and orders them on line.  She uses "hello fresh" to help provide variety and simplify meal preparation.  She feels overwhelmed about meal planning at this time.  She feels discouraged that she no longer can enjoy the foods that she used to enjoy and overall ate very healthy.  She also gained weight since August as she was afraid of  Not having enough food. Although states that her weight increased after hip surgery. She states that her weight is now stable.  155 lbs today. 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  Preferred Learning Style:   No preference indicated   Learning Readiness:   Ready  Change in progress   MEDICATIONS: see list   DIETARY INTAKE:  24-hr recall:  B ( AM): 1/4-1/2 cup dry Aldie's honey nut cheerios, strawberries or bananas or pineapple  Snk ( AM): none  L ( PM): brunswick stew (homemade) OR leftovers Snk ( PM): none D ( PM): homemade pizza,  vegetables, fruit OR chicken, broccoli, white rice Snk ( PM): cheese and crackers occasionally Beverages: water, diet pepsi, whole milk (1 cup)  Usual physical activity: walking (7,500 steps per day)  Estimated energy needs: 1800 calories 75 g protein  Progress Towards Goal(s):  In progress.   Nutritional Diagnosis:  NB-1.1 Food and nutrition-related knowledge deficit As related to multiple food allergies.  As evidenced by patient report.    Intervention:  Nutrition counseling/educastion related to meal planning and healthy eating with multiple food allergies.  Brain stormed meal ideas that she currently does or would like to try.  Patient states that she can spread ideas out on a calendar to avoid excessive repetition of foods.  Also discussed mindful eating as well as changes after further allergies this fall from salads at lunch to heavier food and options to return to salad with a variety of foods that she is not allergic to.  Discussed fear/stress eating and causes.  Discussed follow up with myself as desired or referral to another RD with further allergy expertise.   Mindfulness- choices, eat slow, stop when you are satisfied Avoid fear eating/stress eating Breakfast, lunch, dinner daily. Protein and carbohydrate and small amount healthy fat with each meal.   Meal ideas:  Brainstorming Breakfast:  Eggs (muffin tin eggs), Beef or pork sausage (homemade if needed with pepper, salt, onion powder, etc), cottage cheese, yogurt, ricotta and honey  Quesadilla  Pancakes or  waffles, protein, fruit Cheese toast Crepes, fruit, ricotta Vegetables and eggs Roasted sweet potatoes, protein Yellow corn grits (boil:  2 cups water 1/2 tsp salt then add 1/2 grits, cover and simmer 20 minutes stirring periodically and add more water if needed or remove cover after 20 minutes to thicken).  Serve with cheese or eggs, fruit  Yogurt, fruit, granola (homemade substitute sunflower or pumpkin seeds  instead of nuts) Homemade trail mix  Lunch or dinner: Jordan or other fish, beef, pork, vegetarian, chicken Avocado mayo  Sandwich with above. (leftover roast beef, london broil, chicken sandwich) Salad with homemade dressing, fruit, seeds, beans/corn- homemade dressing  Burrito's/ taco's  with corn or flour tortillas, beef or beans Chili or homemade soup Baked potato with chili Taco salad Mac and cheese Homemade pizza Spaghetti , white wine, clams Chick pea pasta or other with cherry tomatoes, garlic, olive oil, parmesan, mozerella Stirfry Vegetable plate (legume, potato/sweet potato, vegetables, cornbread) Lasagne or stuffed shells or manicotti (try with kale) Meat, starch, allowed vegetable  [email or web address] Lorayne Bender, RD, PhD) as another alternative.  She specializes in food allergies.  MRT allergy testing option.   Teaching Method Utilized:  Auditory   Handouts given during visit include:  Soy allergy nutrition therapy  Barriers to learning/adherence to lifestyle change: overwhelmed, time  Demonstrated degree of understanding via:  Teach Back   Monitoring/Evaluation:  Dietary intake, exercise, label reading, and body weight prn.

## 2017-06-15 NOTE — Patient Instructions (Addendum)
Mindfulness- choices, eat slow, stop when you are satisfied Avoid fear eating/stress eating Breakfast, lunch, dinner daily. Protein and carbohydrate and small amount healthy fat with each meal.   Meal ideas:  Brainstorming Breakfast:  Eggs (muffin tin eggs), Beef or pork sausage (homemade if needed with pepper, salt, onion powder, etc), cottage cheese, yogurt, ricotta and honey  Quesadilla  Pancakes or waffles, protein, fruit Cheese toast Crepes, fruit, ricotta Vegetables and eggs Roasted sweet potatoes, protein Yellow corn grits (boil:  2 cups water 1/2 tsp salt then add 1/2 grits, cover and simmer 20 minutes stirring periodically and add more water if needed or remove cover after 20 minutes to thicken).  Serve with cheese or eggs, fruit  Yogurt, fruit, granola (homemade substitute sunflower or pumpkin seeds instead of nuts) Homemade trail mix  Lunch or dinner: Jordan or other fish, beef, pork, vegetarian, chicken Avocado mayo  Sandwich with above. (leftover roast beef, london broil, chicken sandwich) Salad with homemade dressing, fruit, seeds, beans/corn- homemade dressing  Burrito's/ taco's  with corn or flour tortillas, beef or beans Chili or homemade soup Baked potato with chili Taco salad Mac and cheese Homemade pizza Spaghetti , white wine, clams Chick pea pasta or other with cherry tomatoes, garlic, olive oil, parmesan, mozerella Stirfry Vegetable plate (legume, potato/sweet potato, vegetables, cornbread) Lasagne or stuffed shells or manicotti (try with kale) Meat, starch, allowed vegetable   [email or web address] Lorayne Bender, RD, PhD) as another alternative.  She specializes in food allergies.  MRT allergy testing option.

## 2017-07-15 DIAGNOSIS — J301 Allergic rhinitis due to pollen: Secondary | ICD-10-CM | POA: Diagnosis not present

## 2017-07-15 DIAGNOSIS — K210 Gastro-esophageal reflux disease with esophagitis: Secondary | ICD-10-CM | POA: Diagnosis not present

## 2017-07-15 DIAGNOSIS — J3089 Other allergic rhinitis: Secondary | ICD-10-CM | POA: Diagnosis not present

## 2017-07-15 DIAGNOSIS — J3081 Allergic rhinitis due to animal (cat) (dog) hair and dander: Secondary | ICD-10-CM | POA: Diagnosis not present

## 2017-11-17 DIAGNOSIS — F322 Major depressive disorder, single episode, severe without psychotic features: Secondary | ICD-10-CM | POA: Diagnosis not present

## 2017-11-17 DIAGNOSIS — Z91018 Allergy to other foods: Secondary | ICD-10-CM | POA: Diagnosis not present

## 2017-11-17 DIAGNOSIS — F419 Anxiety disorder, unspecified: Secondary | ICD-10-CM | POA: Diagnosis not present

## 2017-12-24 DIAGNOSIS — H6120 Impacted cerumen, unspecified ear: Secondary | ICD-10-CM | POA: Diagnosis not present

## 2018-01-20 DIAGNOSIS — Z23 Encounter for immunization: Secondary | ICD-10-CM | POA: Diagnosis not present

## 2018-01-20 DIAGNOSIS — F419 Anxiety disorder, unspecified: Secondary | ICD-10-CM | POA: Diagnosis not present

## 2018-01-20 DIAGNOSIS — F322 Major depressive disorder, single episode, severe without psychotic features: Secondary | ICD-10-CM | POA: Diagnosis not present

## 2018-09-07 DIAGNOSIS — Z720 Tobacco use: Secondary | ICD-10-CM | POA: Diagnosis not present

## 2018-09-07 DIAGNOSIS — H9201 Otalgia, right ear: Secondary | ICD-10-CM | POA: Diagnosis not present

## 2019-02-09 DIAGNOSIS — K2100 Gastro-esophageal reflux disease with esophagitis, without bleeding: Secondary | ICD-10-CM | POA: Diagnosis not present

## 2019-02-09 DIAGNOSIS — T781XXD Other adverse food reactions, not elsewhere classified, subsequent encounter: Secondary | ICD-10-CM | POA: Diagnosis not present

## 2019-02-09 DIAGNOSIS — J3081 Allergic rhinitis due to animal (cat) (dog) hair and dander: Secondary | ICD-10-CM | POA: Diagnosis not present

## 2019-02-09 DIAGNOSIS — J301 Allergic rhinitis due to pollen: Secondary | ICD-10-CM | POA: Diagnosis not present

## 2019-04-22 DIAGNOSIS — M79672 Pain in left foot: Secondary | ICD-10-CM | POA: Diagnosis not present

## 2019-07-03 ENCOUNTER — Ambulatory Visit: Payer: Self-pay | Attending: Internal Medicine

## 2019-07-03 DIAGNOSIS — Z23 Encounter for immunization: Secondary | ICD-10-CM | POA: Insufficient documentation

## 2019-07-03 NOTE — Progress Notes (Signed)
   Covid-19 Vaccination Clinic  Name:  Adlean Hardeman    MRN: [id number] DOB: 23-Jul-1980  07/03/2019  Ms. Saldarriaga was observed post Covid-19 immunization for 15 minutes without incident. She was provided with Vaccine Information Sheet and instruction to access the V-Safe system.   Ms. Sando was instructed to call 911 with any severe reactions post vaccine: Marland Kitchen Difficulty breathing  . Swelling of face and throat  . A fast heartbeat  . A bad rash all over body  . Dizziness and weakness   Immunizations Administered    Name Date Dose VIS Date Route   Pfizer COVID-19 Vaccine 07/03/2019  2:50 PM 0.3 mL 04/07/2019 Intramuscular   Manufacturer: [employer], [employer]   Lot: [id number]   NDC: [address]-2

## 2019-08-02 ENCOUNTER — Ambulatory Visit: Payer: Self-pay | Attending: Internal Medicine

## 2019-08-02 DIAGNOSIS — Z23 Encounter for immunization: Secondary | ICD-10-CM

## 2019-08-02 NOTE — Progress Notes (Signed)
   Covid-19 Vaccination Clinic  Name:  Sarah Mcfarland    MRN: [id number] DOB: Sep 10, 1980  08/02/2019  Sarah Mcfarland was observed post Covid-19 immunization for 15 minutes without incident. She was provided with Vaccine Information Sheet and instruction to access the V-Safe system.   Sarah Mcfarland was instructed to call 911 with any severe reactions post vaccine: Marland Kitchen Difficulty breathing  . Swelling of face and throat  . A fast heartbeat  . A bad rash all over body  . Dizziness and weakness   Immunizations Administered    Name Date Dose VIS Date Route   Pfizer COVID-19 Vaccine 08/02/2019 12:28 PM 0.3 mL 04/07/2019 Intramuscular   Manufacturer: [employer], [employer]   Lot: [id number]   NDC: [address]-9

## 2021-01-03 DIAGNOSIS — Z720 Tobacco use: Secondary | ICD-10-CM | POA: Diagnosis not present

## 2022-01-07 ENCOUNTER — Telehealth: Payer: BC Managed Care – PPO | Admitting: Physician Assistant

## 2022-01-07 DIAGNOSIS — L03116 Cellulitis of left lower limb: Secondary | ICD-10-CM

## 2022-01-07 DIAGNOSIS — T148XXA Other injury of unspecified body region, initial encounter: Secondary | ICD-10-CM

## 2022-01-07 MED ORDER — DOXYCYCLINE HYCLATE 100 MG PO TABS: 100.0000 mg | ORAL_TABLET | Freq: Two times a day (BID) | ORAL | 0 refills | Status: DC

## 2022-01-07 MED ORDER — MUPIROCIN 2 % EX OINT: 1.0000 | TOPICAL_OINTMENT | Freq: Two times a day (BID) | CUTANEOUS | 0 refills | Status: DC

## 2022-01-07 NOTE — Progress Notes (Signed)
Virtual Visit Consent   Sarah Mcfarland, you are scheduled for a virtual visit with a [facility] provider today. Just as with appointments in the office, your consent must be obtained to participate. Your consent will be active for this visit and any virtual visit you may have with one of our providers in the next 365 days. If you have a MyChart account, a copy of this consent can be sent to you electronically.  As this is a virtual visit, video technology does not allow for your provider to perform a traditional examination. This may limit your provider's ability to fully assess your condition. If your provider identifies any concerns that need to be evaluated in person or the need to arrange testing (such as labs, EKG, etc.), we will make arrangements to do so. Although advances in technology are sophisticated, we cannot ensure that it will always work on either your end or our end. If the connection with a video visit is poor, the visit may have to be switched to a telephone visit. With either a video or telephone visit, we are not always able to ensure that we have a secure connection.  By engaging in this virtual visit, you consent to the provision of healthcare and authorize for your insurance to be billed (if applicable) for the services provided during this visit. Depending on your insurance coverage, you may receive a charge related to this service.  I need to obtain your verbal consent now. Are you willing to proceed with your visit today? Sarah Mcfarland has provided verbal consent on 01/07/2022 for a virtual visit (video or telephone). Sarah Loveless, PA-C  Date: 01/07/2022 9:35 AM  Virtual Visit via Video Note   I, Sarah Mcfarland, connected with  Sarah Mcfarland  ([id number], 1980/08/03) on 01/07/22 at  9:30 AM EDT by a video-enabled telemedicine application and verified that I am speaking with the correct person using two identifiers.  Location: Patient: Virtual Visit Location Patient:  Home Provider: Virtual Visit Location Provider: Home Office   I discussed the limitations of evaluation and management by telemedicine and the availability of in person appointments. The patient expressed understanding and agreed to proceed.    History of Present Illness: Sarah Mcfarland is a 41 y.o. who identifies as a female who was assigned female at birth, and is being seen today for foot injury.  HPI: Foot Injury  The incident occurred more than 1 week ago. The incident occurred in the yard. The injury mechanism was an inversion injury. The pain is present in the left ankle. The quality of the pain is described as aching and stabbing. The pain is mild. The pain has been Improving since onset. Pertinent negatives include no inability to bear weight, loss of motion, loss of sensation, numbness or tingling. She reports no foreign bodies (did scrape/abrase the left lateral foot just distal to the ankle on the left dorsolateral aspect (2 lesions) and 1 lesion just superior to the 4-5th MTP joints) present. Nothing aggravates the symptoms. She has tried elevation, ice, acetaminophen and rest for the symptoms. The treatment provided moderate relief.   Lesions are becoming more red, tender around areas, and discolored; concerned for infection of skin   Problems:  Patient Active Problem List   Diagnosis Date Noted   Postoperative anemia due to acute blood loss 06/29/2015   Primary osteoarthritis of left hip 06/28/2015   Active labor 12/20/2012    Allergies:  Allergies  Allergen Reactions   Adhesive [Tape]  Paper tape only   Apple Juice Hives and Swelling   Other Hives and Swelling    Pine nuts-throat swells  Pt has OAS - Oral allergy syndrome - food allergy classified by a cluster of allergic reactions in the mouth in response to eating certain (usually fresh) fruits, nuts, and vegetables   Peach [Prunus Persica] Hives and Swelling   Medications:  Current Outpatient Medications:     doxycycline (VIBRA-TABS) 100 MG tablet, Take 1 tablet (100 mg total) by mouth 2 (two) times daily., Disp: 20 tablet, Rfl: 0   mupirocin ointment (BACTROBAN) 2 %, Apply 1 Application topically 2 (two) times daily., Disp: 22 g, Rfl: 0   aspirin EC 325 MG tablet, Take 1 tablet (325 mg total) by mouth 2 (two) times daily after a meal. Take x 1 month post op to decrease risk of blood clots. (Patient not taking: Reported on 06/15/2017), Disp: 60 tablet, Rfl: 0   cetirizine (ZYRTEC) 10 MG tablet, Take 10 mg by mouth daily., Disp: , Rfl:    citalopram (CELEXA) 10 MG tablet, Take 10 mg by mouth daily., Disp: , Rfl:    methocarbamol (ROBAXIN) 500 MG tablet, Take 1 tablet (500 mg total) by mouth every 8 (eight) hours as needed for muscle spasms. (Patient not taking: Reported on 06/15/2017), Disp: 50 tablet, Rfl: 0   omeprazole (PRILOSEC) 20 MG capsule, Take 20 mg by mouth daily., Disp: , Rfl:    oxyCODONE-acetaminophen (PERCOCET/ROXICET) 5-325 MG tablet, Take 1-2 tablets by mouth every 6 (six) hours as needed for severe pain. (Patient not taking: Reported on 06/15/2017), Disp: 50 tablet, Rfl: 0   traMADol (ULTRAM) 50 MG tablet, Take 50 mg by mouth daily., Disp: , Rfl: 0  Observations/Objective: Patient is well-developed, well-nourished in no acute distress.  Resting comfortably at home.  Head is normocephalic, atraumatic.  No labored breathing.  Speech is clear and coherent with logical content.  Patient is alert and oriented at baseline.  Abrasions noted to the left lateral foot just distal to the ankle on the left dorsolateral aspect (2 lesions) and 1 lesion just superior to the 4-5th MTP joints; All with yellow-green fibrin tissue in the wound bed with surrounding erythema. No discharge appreciated.   Assessment and Plan: 1. Open wound of skin - doxycycline (VIBRA-TABS) 100 MG tablet; Take 1 tablet (100 mg total) by mouth 2 (two) times daily.  Dispense: 20 tablet; Refill: 0 - mupirocin ointment  (BACTROBAN) 2 %; Apply 1 Application topically 2 (two) times daily.  Dispense: 22 g; Refill: 0  2. Cellulitis of left lower extremity  - Secondary wound infection noted - Doxycycline and mupirocin prescribed - Epsom salt soaks - Keep wounds clean and dry - Bandage as necessary - Seek in person evaluation for cultures if worsens or fails to improve  Follow Up Instructions: I discussed the assessment and treatment plan with the patient. The patient was provided an opportunity to ask questions and all were answered. The patient agreed with the plan and demonstrated an understanding of the instructions.  A copy of instructions were sent to the patient via MyChart unless otherwise noted below.    The patient was advised to call back or seek an in-person evaluation if the symptoms worsen or if the condition fails to improve as anticipated.  Time:  I spent 8 minutes with the patient via telehealth technology discussing the above problems/concerns.    Sarah Loveless, PA-C

## 2022-01-07 NOTE — Patient Instructions (Signed)
Osa Craver, thank you for joining [name], PA-C for today's virtual visit.  While this provider is not your primary care provider (PCP), if your PCP is located in our provider database this encounter information will be shared with them immediately following your visit.  Consent: (Patient) Sarah Mcfarland provided verbal consent for this virtual visit at the beginning of the encounter.  Current Medications:  Current Outpatient Medications:    doxycycline (VIBRA-TABS) 100 MG tablet, Take 1 tablet (100 mg total) by mouth 2 (two) times daily., Disp: 20 tablet, Rfl: 0   mupirocin ointment (BACTROBAN) 2 %, Apply 1 Application topically 2 (two) times daily., Disp: 22 g, Rfl: 0   aspirin EC 325 MG tablet, Take 1 tablet (325 mg total) by mouth 2 (two) times daily after a meal. Take x 1 month post op to decrease risk of blood clots. (Patient not taking: Reported on 06/15/2017), Disp: 60 tablet, Rfl: 0   cetirizine (ZYRTEC) 10 MG tablet, Take 10 mg by mouth daily., Disp: , Rfl:    citalopram (CELEXA) 10 MG tablet, Take 10 mg by mouth daily., Disp: , Rfl:    methocarbamol (ROBAXIN) 500 MG tablet, Take 1 tablet (500 mg total) by mouth every 8 (eight) hours as needed for muscle spasms. (Patient not taking: Reported on 06/15/2017), Disp: 50 tablet, Rfl: 0   omeprazole (PRILOSEC) 20 MG capsule, Take 20 mg by mouth daily., Disp: , Rfl:    oxyCODONE-acetaminophen (PERCOCET/ROXICET) 5-325 MG tablet, Take 1-2 tablets by mouth every 6 (six) hours as needed for severe pain. (Patient not taking: Reported on 06/15/2017), Disp: 50 tablet, Rfl: 0   traMADol (ULTRAM) 50 MG tablet, Take 50 mg by mouth daily., Disp: , Rfl: 0   Medications ordered in this encounter:  Meds ordered this encounter  Medications   doxycycline (VIBRA-TABS) 100 MG tablet    Sig: Take 1 tablet (100 mg total) by mouth 2 (two) times daily.    Dispense:  20 tablet    Refill:  0    Order Specific Question:   Supervising Provider    Answer:    Merrilee Jansky [[id number]]   mupirocin ointment (BACTROBAN) 2 %    Sig: Apply 1 Application topically 2 (two) times daily.    Dispense:  22 g    Refill:  0    Order Specific Question:   Supervising Provider    Answer:   Merrilee Jansky [id number]     *If you need refills on other medications prior to your next appointment, please contact your pharmacy*  Follow-Up: Call back or seek an in-person evaluation if the symptoms worsen or if the condition fails to improve as anticipated.  Other Instructions  Wound Care, Adult Taking care of your wound properly can help to prevent pain, infection, and scarring. It can also help your wound heal more quickly. Follow instructions from your health care provider about how to care for your wound. Supplies needed: Soap and water. Wound cleanser, saline, or germ-free (sterile) water. Gauze. If needed, a clean bandage (dressing) or other type of wound dressing material to cover or place in the wound. Follow your health care provider's instructions about what dressing supplies to use. Cream or topical ointment to apply to the wound, if told by your health care provider. How to care for your wound Cleaning the wound Ask your health care provider how to clean the wound. This may include: Using mild soap and water, a wound cleanser, saline, or sterile water.  Using a clean gauze to pat the wound dry after cleaning it. Do not rub or scrub the wound. Dressing care Wash your hands with soap and water for at least 20 seconds before and after you change the dressing. If soap and water are not available, use hand sanitizer. Change your dressing as told by your health care provider. This may include: Cleaning or rinsing out (irrigating) the wound. Application of cream or topical ointment, if told by your health care provider. Placing a dressing over the wound or in the wound (packing). Covering the wound with an outer dressing. Leave stitches (sutures),  staples, skin glue, or adhesive strips in place. These skin closures may need to stay in place for 2 weeks or longer. If adhesive strip edges start to loosen and curl up, you may trim the loose edges. Do not remove adhesive strips completely unless your health care provider tells you to do that. Ask your health care provider when you can leave the wound uncovered. Checking for infection Check your wound area every day for signs of infection. Check for: More redness, swelling, or pain. Fluid or blood. Warmth. Pus or a bad smell.  Follow these instructions at home Medicines If you were prescribed an antibiotic medicine, cream, or ointment, take or apply it as told by your health care provider. Do not stop using the antibiotic even if your condition improves. If you were prescribed pain medicine, take it 30 minutes before you do any wound care or as told by your health care provider. Take over-the-counter and prescription medicines only as told by your health care provider. Eating and drinking Eat a diet that includes protein, vitamin A, vitamin C, and other nutrient-rich foods to help the wound heal. Foods rich in protein include meat, fish, eggs, dairy, beans, and nuts. Foods rich in vitamin A include carrots and dark green, leafy vegetables. Foods rich in vitamin C include citrus fruits, tomatoes, broccoli, and peppers. Drink enough fluid to keep your urine pale yellow. General instructions Do not take baths, swim, or use a hot tub until your health care provider approves. Ask your health care provider if you may take showers. You may only be allowed to take sponge baths. Do not scratch or pick at the wound. Keep it covered as told by your health care provider. Return to your normal activities as told by your health care provider. Ask your health care provider what activities are safe for you. Protect your wound from the sun when you are outside for the first 6 months, or for as long as told  by your health care provider. Cover up the scar area or apply sunscreen that has an SPF of at least 30. Do not use any products that contain nicotine or tobacco. These products include cigarettes, chewing tobacco, and vaping devices, such as e-cigarettes. If you need help quitting, ask your health care provider. Keep all follow-up visits. This is important. Contact a health care provider if: You received a tetanus shot and you have swelling, severe pain, redness, or bleeding at the injection site. Your pain is not controlled with medicine. You have any of these signs of infection: More redness, swelling, or pain around the wound. Fluid or blood coming from the wound. Warmth coming from the wound. A fever or chills. You are nauseous or you vomit. You are dizzy. You have a new rash or hardness around the wound. Get help right away if: You have a red streak of skin near the area  around your wound. Pus or a bad smell coming from the wound. Your wound has been closed with staples, sutures, skin glue, or adhesive strips and it begins to open up and separate. Your wound is bleeding, and the bleeding does not stop with gentle pressure. These symptoms may represent a serious problem that is an emergency. Do not wait to see if the symptoms will go away. Get medical help right away. Call your local emergency services (911 in the [place] Do not drive yourself to the hospital. Summary Always wash your hands with soap and water for at least 20 seconds before and after changing your dressing. Change your dressing as told by your health care provider. To help with healing, eat foods that are rich in protein, vitamin A, vitamin C, and other nutrients. Check your wound every day for signs of infection. Contact your health care provider if you think that your wound is infected. This information is not intended to replace advice given to you by your health care provider. Make sure you discuss any questions you  have with your health care provider. Document Revised: 08/20/2020 Document Reviewed: 08/20/2020 Elsevier Patient Education  2023 Elsevier Inc.    If you have been instructed to have an in-person evaluation today at a local Urgent Care facility, please use the link below. It will take you to a list of all of our available [facility], including address, phone number and hours of operation. Please do not delay care.  [facility]  If you or a family member do not have a primary care provider, use the link below to schedule a visit and establish care. When you choose a [facility] primary care physician or advanced practice provider, you gain a long-term partner in health. Find a Primary Care Provider  Learn more about [facility]'s in-office and virtual care options: [facility] - Get Care Now

## 2022-08-03 DIAGNOSIS — Z720 Tobacco use: Secondary | ICD-10-CM | POA: Diagnosis not present

## 2022-10-19 ENCOUNTER — Telehealth: Payer: BC Managed Care – PPO | Admitting: Physician Assistant

## 2022-10-19 DIAGNOSIS — B9689 Other specified bacterial agents as the cause of diseases classified elsewhere: Secondary | ICD-10-CM | POA: Diagnosis not present

## 2022-10-19 DIAGNOSIS — J028 Acute pharyngitis due to other specified organisms: Secondary | ICD-10-CM | POA: Diagnosis not present

## 2022-10-19 DIAGNOSIS — H6011 Cellulitis of right external ear: Secondary | ICD-10-CM | POA: Diagnosis not present

## 2022-10-19 MED ORDER — AMOXICILLIN-POT CLAVULANATE 875-125 MG PO TABS: 1.0000 | ORAL_TABLET | Freq: Two times a day (BID) | ORAL | 0 refills | Status: DC

## 2022-10-19 NOTE — Patient Instructions (Signed)
Sarah Mcfarland, thank you for joining Sarah Loveless, PA-C for today's virtual visit.  While this provider is not your primary care provider (PCP), if your PCP is located in our provider database this encounter information will be shared with them immediately following your visit.   A [facility] MyChart account gives you access to today's visit and all your visits, tests, and labs performed at [facility] " click here if you don't have a [facility] MyChart account or go to [email or web address]  Consent: (Patient) Sarah Mcfarland provided verbal consent for this virtual visit at the beginning of the encounter.  Current Medications:  Current Outpatient Medications:    amoxicillin-clavulanate (AUGMENTIN) 875-125 MG tablet, Take 1 tablet by mouth 2 (two) times daily., Disp: 20 tablet, Rfl: 0   aspirin EC 325 MG tablet, Take 1 tablet (325 mg total) by mouth 2 (two) times daily after a meal. Take x 1 month post op to decrease risk of blood clots. (Patient not taking: Reported on 06/15/2017), Disp: 60 tablet, Rfl: 0   cetirizine (ZYRTEC) 10 MG tablet, Take 10 mg by mouth daily., Disp: , Rfl:    citalopram (CELEXA) 10 MG tablet, Take 10 mg by mouth daily., Disp: , Rfl:    methocarbamol (ROBAXIN) 500 MG tablet, Take 1 tablet (500 mg total) by mouth every 8 (eight) hours as needed for muscle spasms. (Patient not taking: Reported on 06/15/2017), Disp: 50 tablet, Rfl: 0   mupirocin ointment (BACTROBAN) 2 %, Apply 1 Application topically 2 (two) times daily., Disp: 22 g, Rfl: 0   omeprazole (PRILOSEC) 20 MG capsule, Take 20 mg by mouth daily., Disp: , Rfl:    oxyCODONE-acetaminophen (PERCOCET/ROXICET) 5-325 MG tablet, Take 1-2 tablets by mouth every 6 (six) hours as needed for severe pain. (Patient not taking: Reported on 06/15/2017), Disp: 50 tablet, Rfl: 0   traMADol (ULTRAM) 50 MG tablet, Take 50 mg by mouth daily., Disp: , Rfl: 0   Medications ordered in this encounter:  Meds ordered this  encounter  Medications   amoxicillin-clavulanate (AUGMENTIN) 875-125 MG tablet    Sig: Take 1 tablet by mouth 2 (two) times daily.    Dispense:  20 tablet    Refill:  0    Order Specific Question:   Supervising Provider    Answer:   Merrilee Jansky [id number]     *If you need refills on other medications prior to your next appointment, please contact your pharmacy*  Follow-Up: Call back or seek an in-person evaluation if the symptoms worsen or if the condition fails to improve as anticipated.  [facility] [phone]  Other Instructions  Cellulitis, Adult  Cellulitis is a skin infection. The infected area is often warm, red, swollen, and sore. It occurs most often on the legs, feet, and toes, but can happen on any part of the body. This condition can be life-threatening without treatment. It is very important to get treated right away. What are the causes? This condition is caused by bacteria. The bacteria enter through a break in the skin, such as: A cut. A burn. A bug bite. An animal bite. An open sore. A crack. What increases the risk? Having a weak body's defense system (immune system). Being older than 43 years old. Having a blood sugar problem (diabetes). Having a long-term liver disease (cirrhosis) or kidney disease. Being very overweight (obese). Having a skin problem, such as: An itchy rash. A rash caused by a fungus. A rash with blisters. Slow  movement of blood in the veins (venous stasis). Fluid buildup below the skin (edema). This condition is more likely to occur in people who: Have open cuts, burns, bites, or scrapes on the skin. Have been treated with high-energy rays (radiation). Use IV drugs. What are the signs or symptoms? Skin that: Looks red or purple, or slightly darker than your usual skin color. Has streaks. Has spots. Is swollen. Is sore or painful when you touch it. Is warm. A fever. Chills. Blisters. Tiredness  (fatigue). How is this treated? Medicines to treat infections or allergies. Rest. Placing cold or warm cloths on the skin. Staying in the hospital, if the condition is very bad. You may need medicines through an IV. Follow these instructions at home: Medicines Take over-the-counter and prescription medicines only as told by your doctor. If you were prescribed antibiotics, take them as told by your doctor. Do not stop using them even if you start to feel better. General instructions Drink enough fluid to keep your pee (urine) pale yellow. Do not touch or rub the infected area. Raise (elevate) the infected area above the level of your heart while you are sitting or lying down. Return to your normal activities when your doctor says that it is safe. Place cold or warm cloths on the area as told by your doctor. Keep all follow-up visits. Your doctor will need to make sure that a more serious infection is not developing. Contact a doctor if: You have a fever. You do not start to get better after 1-2 days of treatment. Your bone or joint under the infected area starts to hurt after the skin has healed. Your infection comes back in the same area or another area. Signs of this may include: You have a swollen bump in the area. Your red area gets larger, turns dark in color, or hurts more. You have more fluid coming from the wound. Pus or a bad smell develops in your infected area. You have more pain. You feel sick and have muscle aches and weakness. You develop vomiting or watery poop that will not go away. Get help right away if: You see red streaks coming from the area. You notice the skin turns purple or black and falls off. These symptoms may be an emergency. Get help right away. Call 911. Do not wait to see if the symptoms will go away. Do not drive yourself to the hospital. This information is not intended to replace advice given to you by your health care provider. Make sure you  discuss any questions you have with your health care provider. Document Revised: 12/09/2021 Document Reviewed: 12/09/2021 Elsevier Patient Education  2024 Elsevier Inc.   Pharyngitis  Pharyngitis is a sore throat (pharynx). This is when there is redness, pain, and swelling in your throat. Most of the time, this condition gets better on its own. In some cases, you may need medicine. What are the causes? An infection from a virus. An infection from bacteria. Allergies. What increases the risk? Being 44-30 years old. Being in crowded environments. These include: Daycares. Schools. Dormitories. Living in a place with cold temperatures outside. Having a weakened disease-fighting (immune) system. What are the signs or symptoms? Symptoms may vary depending on the cause. Common symptoms include: Sore throat. Tiredness (fatigue). Low-grade fever. Stuffy nose. Cough. Headache. Other symptoms may include: Glands in the neck (lymph nodes) that are swollen. Skin rashes. Film on the throat or tonsils. This can be caused by an infection from bacteria. Vomiting.  Red, itchy eyes. Loss of appetite. Joint pain and muscle aches. Tonsils that are temporarily bigger than usual (enlarged). How is this treated? Many times, treatment is not needed. This condition usually gets better in 3-4 days without treatment. If the infection is caused by a bacteria, you may be need to take antibiotics. Follow these instructions at home: Medicines Take over-the-counter and prescription medicines only as told by your doctor. If you were prescribed an antibiotic medicine, take it as told by your doctor. Do not stop taking the antibiotic even if you start to feel better. Use throat lozenges or sprays to soothe your throat as told by your doctor. Children can get pharyngitis. Do not give your child aspirin. Managing pain To help with pain, try: Sipping warm liquids, such as: Broth. Herbal tea. Warm  water. Eating or drinking cold or frozen liquids, such as frozen ice pops. Rinsing your mouth (gargle) with a salt water mixture 3-4 times a day or as needed. To make salt water, dissolve -1 tsp (3-6 g) of salt in 1 cup (237 mL) of warm water. Do not swallow this mixture. Sucking on hard candy or throat lozenges. Putting a cool-mist humidifier in your bedroom at night to moisten the air. Sitting in the bathroom with the door closed for 5-10 minutes while you run hot water in the shower.  General instructions  Do not smoke or use any products that contain nicotine or tobacco. If you need help quitting, ask your doctor. Rest as told by your doctor. Drink enough fluid to keep your pee (urine) pale yellow. How is this prevented? Wash your hands often for at least 20 seconds with soap and water. If soap and water are not available, use hand sanitizer. Do not touch your eyes, nose, or mouth with unwashed hands. Wash hands after touching these areas. Do not share cups or eating utensils. Avoid close contact with people who are sick. Contact a doctor if: You have large, tender lumps in your neck. You have a rash. You cough up green, yellow-brown, or bloody spit. Get help right away if: You have a stiff neck. You drool or cannot swallow liquids. You cannot drink or take medicines without vomiting. You have very bad pain that does not go away with medicine. You have problems breathing, and it is not from a stuffy nose. You have new pain and swelling in your knees, ankles, wrists, or elbows. These symptoms may be an emergency. Get help right away. Call your local emergency services (911 in the [place] Do not wait to see if the symptoms will go away. Do not drive yourself to the hospital. Summary Pharyngitis is a sore throat (pharynx). This is when there is redness, pain, and swelling in your throat. Most of the time, pharyngitis gets better on its own. Sometimes, you may need medicine. If  you were prescribed an antibiotic medicine, take it as told by your doctor. Do not stop taking the antibiotic even if you start to feel better. This information is not intended to replace advice given to you by your health care provider. Make sure you discuss any questions you have with your health care provider. Document Revised: 07/10/2020 Document Reviewed: 07/10/2020 Elsevier Patient Education  2024 Elsevier Inc.    If you have been instructed to have an in-person evaluation today at a local Urgent Care facility, please use the link below. It will take you to a list of all of our available [facility], including address, phone  number and hours of operation. Please do not delay care.  [facility]  If you or a family member do not have a primary care provider, use the link below to schedule a visit and establish care. When you choose a [facility] primary care physician or advanced practice provider, you gain a long-term partner in health. Find a Primary Care Provider  Learn more about [facility]'s in-office and virtual care options: [facility]

## 2022-10-19 NOTE — Progress Notes (Signed)
Virtual Visit Consent   Sarah Mcfarland, you are scheduled for a virtual visit with a [facility] provider today. Just as with appointments in the office, your consent must be obtained to participate. Your consent will be active for this visit and any virtual visit you may have with one of our providers in the next 365 days. If you have a MyChart account, a copy of this consent can be sent to you electronically.  As this is a virtual visit, video technology does not allow for your provider to perform a traditional examination. This may limit your provider's ability to fully assess your condition. If your provider identifies any concerns that need to be evaluated in person or the need to arrange testing (such as labs, EKG, etc.), we will make arrangements to do so. Although advances in technology are sophisticated, we cannot ensure that it will always work on either your end or our end. If the connection with a video visit is poor, the visit may have to be switched to a telephone visit. With either a video or telephone visit, we are not always able to ensure that we have a secure connection.  By engaging in this virtual visit, you consent to the provision of healthcare and authorize for your insurance to be billed (if applicable) for the services provided during this visit. Depending on your insurance coverage, you may receive a charge related to this service.  I need to obtain your verbal consent now. Are you willing to proceed with your visit today? Sarah Mcfarland has provided verbal consent on 10/19/2022 for a virtual visit (video or telephone). [name], PA-C  Date: 10/19/2022 8:41 AM  Virtual Visit via Video Note   I, [name], connected with  Sarah Mcfarland  ([id number], 30-Nov-1972) on 10/19/22 at  8:30 AM EDT by a video-enabled telemedicine application and verified that I am speaking with the correct person using two identifiers.  Location: Patient: Virtual Visit Location Patient:  Home Provider: Virtual Visit Location Provider: Home Office   I discussed the limitations of evaluation and management by telemedicine and the availability of in person appointments. The patient expressed understanding and agreed to proceed.    History of Present Illness: Sarah Mcfarland is a 42 y.o. who identifies as a female who was assigned female at birth, and is being seen today for sore throat.  HPI: Sore Throat  This is a new problem. The current episode started in the past 7 days. The problem has been gradually worsening. The maximum temperature recorded prior to her arrival was 101 - 101.9 F. The fever has been present for 1 to 2 days. The pain is moderate. Associated symptoms include congestion, coughing (mild), ear pain (right ear is swollen), swollen glands and trouble swallowing. Pertinent negatives include no diarrhea, ear discharge, headaches, hoarse voice, plugged ear sensation, neck pain or vomiting. She has had no exposure to strep or mono. She has tried NSAIDs for the symptoms. The treatment provided no relief.     Problems:  Patient Active Problem List   Diagnosis Date Noted   Postoperative anemia due to acute blood loss 06/29/2015   Primary osteoarthritis of left hip 06/28/2015   Active labor 12/20/2012    Allergies:  Allergies  Allergen Reactions   Adhesive [Tape]     Paper tape only   Apple Juice Hives and Swelling   Other Hives and Swelling    Pine nuts-throat swells  Pt has OAS - Oral allergy syndrome - food  allergy classified by a cluster of allergic reactions in the mouth in response to eating certain (usually fresh) fruits, nuts, and vegetables   Peach [Prunus Persica] Hives and Swelling   Medications:  Current Outpatient Medications:    amoxicillin-clavulanate (AUGMENTIN) 875-125 MG tablet, Take 1 tablet by mouth 2 (two) times daily., Disp: 20 tablet, Rfl: 0   aspirin EC 325 MG tablet, Take 1 tablet (325 mg total) by mouth 2 (two) times daily after a meal.  Take x 1 month post op to decrease risk of blood clots. (Patient not taking: Reported on 06/15/2017), Disp: 60 tablet, Rfl: 0   cetirizine (ZYRTEC) 10 MG tablet, Take 10 mg by mouth daily., Disp: , Rfl:    citalopram (CELEXA) 10 MG tablet, Take 10 mg by mouth daily., Disp: , Rfl:    methocarbamol (ROBAXIN) 500 MG tablet, Take 1 tablet (500 mg total) by mouth every 8 (eight) hours as needed for muscle spasms. (Patient not taking: Reported on 06/15/2017), Disp: 50 tablet, Rfl: 0   mupirocin ointment (BACTROBAN) 2 %, Apply 1 Application topically 2 (two) times daily., Disp: 22 g, Rfl: 0   omeprazole (PRILOSEC) 20 MG capsule, Take 20 mg by mouth daily., Disp: , Rfl:    oxyCODONE-acetaminophen (PERCOCET/ROXICET) 5-325 MG tablet, Take 1-2 tablets by mouth every 6 (six) hours as needed for severe pain. (Patient not taking: Reported on 06/15/2017), Disp: 50 tablet, Rfl: 0   traMADol (ULTRAM) 50 MG tablet, Take 50 mg by mouth daily., Disp: , Rfl: 0  Observations/Objective: Patient is well-developed, well-nourished in no acute distress.  Resting comfortably at home.  Head is normocephalic, atraumatic.  No labored breathing.  Speech is clear and coherent with logical content.  Patient is alert and oriented at baseline.    Assessment and Plan: 1. Acute bacterial pharyngitis - amoxicillin-clavulanate (AUGMENTIN) 875-125 MG tablet; Take 1 tablet by mouth 2 (two) times daily.  Dispense: 20 tablet; Refill: 0  2. Cellulitis of auricle of right ear - amoxicillin-clavulanate (AUGMENTIN) 875-125 MG tablet; Take 1 tablet by mouth 2 (two) times daily.  Dispense: 20 tablet; Refill: 0  - Worsening symptoms that have not responded to OTC medications.  - Will give Augmentin - Continue allergy medications.  - Steam and humidifier can help - Cold compress for ear - Stay well hydrated and get plenty of rest.  - Seek in person evaluation if no symptom improvement or if symptoms worsen   Follow Up Instructions: I  discussed the assessment and treatment plan with the patient. The patient was provided an opportunity to ask questions and all were answered. The patient agreed with the plan and demonstrated an understanding of the instructions.  A copy of instructions were sent to the patient via MyChart unless otherwise noted below.    The patient was advised to call back or seek an in-person evaluation if the symptoms worsen or if the condition fails to improve as anticipated.  Time:  I spent 10 minutes with the patient via telehealth technology discussing the above problems/concerns.    [name], PA-C

## 2023-10-23 DIAGNOSIS — B354 Tinea corporis: Secondary | ICD-10-CM | POA: Diagnosis not present

## 2024-01-22 ENCOUNTER — Ambulatory Visit
Admission: EM | Admit: 2024-01-22 | Discharge: 2024-01-22 | Disposition: A | Discharge status: Home / Self Care | Attending: Nurse Practitioner | Admitting: Nurse Practitioner

## 2024-01-22 ENCOUNTER — Encounter: Payer: Self-pay | Admitting: Emergency Medicine

## 2024-01-22 ENCOUNTER — Ambulatory Visit (INDEPENDENT_AMBULATORY_CARE_PROVIDER_SITE_OTHER)

## 2024-01-22 DIAGNOSIS — M79644 Pain in right finger(s): Secondary | ICD-10-CM

## 2024-01-22 DIAGNOSIS — S01112A Laceration without foreign body of left eyelid and periocular area, initial encounter: Secondary | ICD-10-CM

## 2024-01-22 DIAGNOSIS — S63616A Unspecified sprain of right little finger, initial encounter: Secondary | ICD-10-CM

## 2024-01-22 MED ORDER — TETANUS-DIPHTH-ACELL PERTUSSIS 5-2.5-18.5 LF-MCG/0.5 IM SUSY
0.5000 mL | PREFILLED_SYRINGE | Freq: Once | INTRAMUSCULAR | Status: AC
  Administered 2024-01-22: 0.5 mL via INTRAMUSCULAR

## 2024-01-22 NOTE — ED Triage Notes (Addendum)
 Pt st's she got up during the night to use the restroom and fell over some clothes that was on the floor   Pt st's she hit her head on the side of the bed.  Pt has lac to left eye brow and also c/o pain to right 5th finger, swelling and bruising present. Pt denies LOC

## 2024-01-22 NOTE — Discharge Instructions (Addendum)
 You were seen today for a cut which is also known as a laceration.  A laceration is a type of injury that can go through all layers of the skin and sometimes into the tissue underneath. Some cuts can heal on their own, but others need to be closed to help them heal properly and reduce the risk of infection. Your cut was treated with stitches. Keep the dressing on for the next 24 hours and do not let it get wet. After 24 hours, you can remove the dressing. Clean the area gently once a day using warm water and antibacterial soap. After cleaning, pat the area dry with a clean towel. Do not rub the wound, scratch it, or pick at it. Avoid using disinfectants, antiseptics, ointments, creams, or liquid medicines on the wound. Keep the area covered with a non-stick dressing for the next couple of days. If you're at home and not doing anything that could get the wound dirty, you can leave it uncovered, as air helps promote healing. Please return here or follow up with your healthcare provider in one week to have the stitches removed. If you notice increased redness, swelling, pus, or worsening pain, contact your provider right away.  You were also seen for pain in your pinky finger. Your finger X-ray did not show any fracture or dislocation. Your pain is most likely due to a soft tissue injury. You can manage this at home with RICE therapy: rest the finger, apply ice packs for 15-20 minutes several times a day, use a compression wrap if comfortable, and keep the hand elevated to reduce swelling. You may take over-the-counter ibuprofen  or naproxen as needed for pain. Follow up with orthopedics if your symptoms do not improve, worsen, or if you develop new concerns such as increased swelling, redness, or inability to move the finger. Go to the emergency department if you develop sudden severe pain, numbness, or loss of circulation in the finger.

## 2024-01-22 NOTE — ED Provider Notes (Signed)
 [facility]    CSN: [id number] Arrival date & time: 01/22/24  9193      History   Chief Complaint Chief Complaint  Patient presents with   Fall   Laceration   Finger Injury    HPI Sarah Mcfarland is a 43 y.o. female.   Discussed the use of AI scribe software for clinical note transcription with the patient, who gave verbal consent to proceed.   Patient presents with a laceration to the left eyebrow and pain involving the right fifth finger following a fall around 3 AM last night. She states she got up to use the bathroom and tripped over clothing on the floor, striking her head against the side of the bed. She denies loss of consciousness, headache, dizziness, blurred vision, neck pain, back pain, nausea, or vomiting. Finger pain was not noticed until later in the morning upon waking. She denies any numbness or tingling of the finger. No other injuries noted. Last tetanus vaccination documented on 12/14/2012.  The following sections of the patient's history were reviewed and updated as appropriate: allergies, current medications, past family history, past medical history, past social history, past surgical history, and problem list.     Past Medical History:  Diagnosis Date   Anxiety    Arthritis    History of anemia    during pregnancy   Oral allergy syndrome    PONV (postoperative nausea and vomiting)     Patient Active Problem List   Diagnosis Date Noted   Postoperative anemia due to acute blood loss 06/29/2015   Primary osteoarthritis of left hip 06/28/2015   Active labor 12/20/2012    Past Surgical History:  Procedure Laterality Date   SHOULDER SURGERY Left    TOTAL HIP ARTHROPLASTY Left 06/28/2015   Procedure: TOTAL HIP ARTHROPLASTY ANTERIOR APPROACH;  Surgeon: Norleen Gavel, MD;  Location: [facility] OR;  Service: Orthopedics;  Laterality: Left;   WISDOM TOOTH EXTRACTION      OB History     Gravida  4   Para  2   Term  2   Preterm      AB  2    Living  2      SAB  2   IAB      Ectopic      Multiple      Live Births  1            Home Medications    Prior to Admission medications   Not on File    Family History Family History  Problem Relation Age of Onset   Alcohol abuse Neg Hx     Social History Social History   Tobacco Use   Smoking status: Every Day    Current packs/day: 0.50    Types: Cigarettes  Substance Use Topics   Alcohol use: Yes    Comment: rarely   Drug use: No     Allergies   Adhesive [tape], Apple juice, Other, and Peach [prunus persica]   Review of Systems Review of Systems  Eyes:  Negative for visual disturbance.  Gastrointestinal:  Negative for nausea and vomiting.  Musculoskeletal:  Positive for arthralgias. Negative for back pain and neck pain.  Skin:  Positive for wound.  Neurological:  Negative for dizziness and headaches.  All other systems reviewed and are negative.    Physical Exam Triage Vital Signs ED Triage Vitals  Encounter Vitals Group     BP 01/22/24 0908 (!) 162/96     Girls  Systolic BP Percentile --      Girls Diastolic BP Percentile --      Boys Systolic BP Percentile --      Boys Diastolic BP Percentile --      Pulse Rate 01/22/24 0908 (!) 126     Resp 01/22/24 0908 20     Temp 01/22/24 0908 98.4 F (36.9 C)     Temp Source 01/22/24 0908 Oral     SpO2 01/22/24 0908 98 %     Weight --      Height --      Head Circumference --      Peak Flow --      Pain Score 01/22/24 0910 2     Pain Loc --      Pain Education --      Exclude from Growth Chart --    No data found.  Updated Vital Signs BP (!) 162/96 (BP Location: Right Arm)   Pulse (!) 126   Temp 98.4 F (36.9 C) (Oral)   Resp 20   LMP 01/01/2024 (Exact Date)   SpO2 98%   Visual Acuity Right Eye Distance:   Left Eye Distance:   Bilateral Distance:    Right Eye Near:   Left Eye Near:    Bilateral Near:     Physical Exam Vitals reviewed.  Constitutional:      General:  She is awake. She is not in acute distress.    Appearance: Normal appearance. She is well-developed. She is not ill-appearing, toxic-appearing or diaphoretic.  HENT:     Head: Normocephalic.     Right Ear: Hearing normal.     Left Ear: Hearing normal.     Nose: Nose normal.     Mouth/Throat:     Mouth: Mucous membranes are moist.  Eyes:     General: Vision grossly intact.     Conjunctiva/sclera: Conjunctivae normal.  Cardiovascular:     Rate and Rhythm: Normal rate and regular rhythm.     Heart sounds: Normal heart sounds.  Pulmonary:     Effort: Pulmonary effort is normal.     Breath sounds: Normal breath sounds and air entry.  Musculoskeletal:        General: Normal range of motion.     Right hand: Swelling and tenderness present. No deformity. Normal range of motion. Normal strength. Normal sensation. Normal capillary refill.     Cervical back: Normal range of motion and neck supple.     Comments: Pain localized to the proximal aspect of the right fifth finger with mild swelling. No erythema is present. Strength, sensation, and range of motion are fully intact, with normal capillary refill. The remainder of the fingers and hand show no abnormalities.  Skin:    General: Skin is warm and dry.     Findings: Laceration present.     Comments: 3 cm laceration is present within the left eyebrow, accompanied by mild surrounding ecchymosis.   Neurological:     General: No focal deficit present.     Mental Status: She is alert and oriented to person, place, and time.  Psychiatric:        Speech: Speech normal.        Behavior: Behavior is cooperative.      UC Treatments / Results  Labs (all labs ordered are listed, but only abnormal results are displayed) Labs Reviewed - No data to display  EKG   Radiology DG Hand Complete Right Result Date: 01/22/2024 CLINICAL DATA:  Fifth finger pain after falling last night. EXAM: RIGHT HAND - COMPLETE 3+ VIEW COMPARISON:  None Available.  FINDINGS: The small finger is poorly visualized on the lateral view due to overlap with the other fingers. The mineralization and alignment are normal. There is no evidence of acute fracture or dislocation. The joint spaces appear preserved. No foreign body or other focal soft tissue abnormality identified. IMPRESSION: No evidence of acute fracture or dislocation. Electronically Signed   By: Elsie Perone M.D.   On: 01/22/2024 09:59    Procedures Laceration Repair  Date/Time: 01/22/2024 10:25 AM  Performed by: Iola Lukes, FNP Authorized by: Iola Lukes, FNP   Consent:    Consent obtained:  Verbal   Consent given by:  Patient   Risks, benefits, and alternatives were discussed: yes     Risks discussed:  Infection, pain and poor cosmetic result Universal protocol:    Patient identity confirmed:  Verbally with patient and arm band Anesthesia:    Anesthesia method:  Local infiltration   Local anesthetic:  Lidocaine  1% w/o epi Laceration details:    Location:  Face   Face location:  L eyebrow   Length (cm):  3 Exploration:    Contaminated: no   Treatment:    Area cleansed with:  Shur-Clens   Amount of cleaning:  Standard Skin repair:    Repair method:  Sutures   Suture size:  5-0   Suture material:  Prolene   Suture technique:  Simple interrupted   Number of sutures:  7 Approximation:    Approximation:  Close Repair type:    Repair type:  Simple Post-procedure details:    Dressing:  Antibiotic ointment and adhesive bandage   Procedure completion:  Tolerated well, no immediate complications  (including critical care time)   Medications Ordered in UC Medications  Tdap (BOOSTRIX ) injection 0.5 mL (0.5 mLs Intramuscular Given 01/22/24 1035)    Initial Impression / Assessment and Plan / UC Course  I have reviewed the triage vital signs and the nursing notes.  Pertinent labs & imaging results that were available during my care of the patient were reviewed by me  and considered in my medical decision making (see chart for details).    Patient evaluated for laceration to the left eyebrow status post repair with sutures. Examination revealed no evidence of tendon, nerve, or vascular injury. Tdap vaccination was updated today. Wound care instructions were reviewed, including keeping the area clean and dry, monitoring for signs of infection, and activity modifications to protect the repair. Patient advised to follow up for suture removal in 1 week  and to seek prompt care for increasing pain, swelling, redness, drainage, or fever.  Patient also with pain to the right 5th finger. XR negative for any acute fracture or dislocation. Finger splint applied. RICE therapy advised. Ortho follow up as needed.   Today's evaluation has revealed no signs of a dangerous process. Discussed diagnosis with patient and/or guardian. Patient and/or guardian aware of their diagnosis, possible red flag symptoms to watch out for and need for close follow up. Patient and/or guardian understands verbal and written discharge instructions. Patient and/or guardian comfortable with plan and disposition.  Patient and/or guardian has a clear mental status at this time, good insight into illness (after discussion and teaching) and has clear judgment to make decisions regarding their care  Documentation was completed with the aid of voice recognition software. Transcription may contain typographical errors.    Final Clinical Impressions(s) / UC Diagnoses  Final diagnoses:  Pain in finger of right hand  Sprain of right little finger, unspecified site of digit, initial encounter  Eyebrow laceration, left, initial encounter     Discharge Instructions      You were seen today for a cut which is also known as a laceration.  A laceration is a type of injury that can go through all layers of the skin and sometimes into the tissue underneath. Some cuts can heal on their own, but others need to  be closed to help them heal properly and reduce the risk of infection. Your cut was treated with stitches. Keep the dressing on for the next 24 hours and do not let it get wet. After 24 hours, you can remove the dressing. Clean the area gently once a day using warm water and antibacterial soap. After cleaning, pat the area dry with a clean towel. Do not rub the wound, scratch it, or pick at it. Avoid using disinfectants, antiseptics, ointments, creams, or liquid medicines on the wound. Keep the area covered with a non-stick dressing for the next couple of days. If you're at home and not doing anything that could get the wound dirty, you can leave it uncovered, as air helps promote healing. Please return here or follow up with your healthcare provider in one week to have the stitches removed. If you notice increased redness, swelling, pus, or worsening pain, contact your provider right away.  You were also seen for pain in your pinky finger. Your finger X-ray did not show any fracture or dislocation. Your pain is most likely due to a soft tissue injury. You can manage this at home with RICE therapy: rest the finger, apply ice packs for 15-20 minutes several times a day, use a compression wrap if comfortable, and keep the hand elevated to reduce swelling. You may take over-the-counter ibuprofen  or naproxen as needed for pain. Follow up with orthopedics if your symptoms do not improve, worsen, or if you develop new concerns such as increased swelling, redness, or inability to move the finger. Go to the emergency department if you develop sudden severe pain, numbness, or loss of circulation in the finger.     ED Prescriptions   None    PDMP not reviewed this encounter.   Iola Lukes, [place] 01/22/24 1036

## 2024-01-29 ENCOUNTER — Ambulatory Visit
Admission: RE | Admit: 2024-01-29 | Discharge: 2024-01-29 | Disposition: A | Discharge status: Home / Self Care | Payer: Self-pay | Source: Ambulatory Visit

## 2024-01-29 DIAGNOSIS — Z4802 Encounter for removal of sutures: Secondary | ICD-10-CM | POA: Diagnosis not present

## 2024-01-29 NOTE — ED Triage Notes (Addendum)
 Pt here for suture removal. 7 sutures placed to left eyebrow on 01/22/24

## 2024-03-09 ENCOUNTER — Ambulatory Visit: Admitting: Family
